# Patient Record
Sex: Male | Born: 1980
Health system: Southern US, Community
[De-identification: ages and names within clinical notes are randomized; demographics above are authoritative.]

## PROBLEM LIST (undated history)

## (undated) DIAGNOSIS — F411 Generalized anxiety disorder: Secondary | ICD-10-CM

## (undated) DIAGNOSIS — R21 Rash and other nonspecific skin eruption: Secondary | ICD-10-CM

## (undated) DIAGNOSIS — K219 Gastro-esophageal reflux disease without esophagitis: Secondary | ICD-10-CM

## (undated) DIAGNOSIS — IMO0001 Reserved for inherently not codable concepts without codable children: Secondary | ICD-10-CM

## (undated) DIAGNOSIS — J302 Other seasonal allergic rhinitis: Secondary | ICD-10-CM

## (undated) HISTORY — DX: Generalized anxiety disorder: F41.1

## (undated) HISTORY — PX: WISDOM TOOTH EXTRACTION: SHX21

## (undated) HISTORY — DX: Gastro-esophageal reflux disease without esophagitis: K21.9

## (undated) HISTORY — DX: Other seasonal allergic rhinitis: J30.2

---
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
## 2002-03-29 ENCOUNTER — Emergency Department (HOSPITAL_COMMUNITY): Admission: EM | Admit: 2002-03-29 | Discharge: 2002-03-29 | Payer: Self-pay | Admitting: *Deleted

## 2002-03-29 ENCOUNTER — Encounter: Payer: Self-pay | Admitting: Emergency Medicine

## 2004-02-10 ENCOUNTER — Encounter: Payer: Self-pay | Admitting: Emergency Medicine

## 2004-02-10 ENCOUNTER — Observation Stay (HOSPITAL_COMMUNITY): Admission: EM | Admit: 2004-02-10 | Discharge: 2004-02-11 | Payer: Self-pay | Admitting: Emergency Medicine

## 2004-02-13 ENCOUNTER — Emergency Department (HOSPITAL_COMMUNITY): Admission: EM | Admit: 2004-02-13 | Discharge: 2004-02-13 | Payer: Self-pay | Admitting: *Deleted

## 2006-05-01 ENCOUNTER — Emergency Department (HOSPITAL_COMMUNITY): Admission: EM | Admit: 2006-05-01 | Discharge: 2006-05-02 | Payer: Self-pay | Admitting: Emergency Medicine
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
## 2012-04-09 ENCOUNTER — Emergency Department (HOSPITAL_BASED_OUTPATIENT_CLINIC_OR_DEPARTMENT_OTHER)
Admission: EM | Admit: 2012-04-09 | Discharge: 2012-04-09 | Disposition: A | Discharge status: Home / Self Care | Payer: Self-pay | Attending: Emergency Medicine | Admitting: Emergency Medicine

## 2012-04-09 ENCOUNTER — Emergency Department (HOSPITAL_BASED_OUTPATIENT_CLINIC_OR_DEPARTMENT_OTHER): Payer: Self-pay

## 2012-04-09 ENCOUNTER — Encounter (HOSPITAL_BASED_OUTPATIENT_CLINIC_OR_DEPARTMENT_OTHER): Payer: Self-pay

## 2012-04-09 DIAGNOSIS — K122 Cellulitis and abscess of mouth: Secondary | ICD-10-CM | POA: Insufficient documentation

## 2012-04-09 DIAGNOSIS — F17200 Nicotine dependence, unspecified, uncomplicated: Secondary | ICD-10-CM | POA: Insufficient documentation

## 2012-04-09 LAB — CBC WITH DIFFERENTIAL/PLATELET
Basophils Relative: 0 % (ref 0–1)
HCT: 42.6 % (ref 39.0–52.0)
Lymphs Abs: 1.5 10*3/uL (ref 0.7–4.0)
MCH: 32.7 pg (ref 26.0–34.0)
MCV: 94 fL (ref 78.0–100.0)
Neutrophils Relative %: 83 % — ABNORMAL HIGH (ref 43–77)
RBC: 4.53 MIL/uL (ref 4.22–5.81)
RDW: 12.2 % (ref 11.5–15.5)

## 2012-04-09 LAB — RAPID STREP SCREEN (MED CTR MEBANE ONLY): Streptococcus, Group A Screen (Direct): NEGATIVE

## 2012-04-09 MED ORDER — CEFTRIAXONE SODIUM 1 G IJ SOLR
1.0000 g | Freq: Once | INTRAMUSCULAR | Status: AC
  Administered 2012-04-09: 1 g via INTRAMUSCULAR
  Filled 2012-04-09: qty 10

## 2012-04-09 MED ORDER — LIDOCAINE HCL (PF) 1 % IJ SOLN
INTRAMUSCULAR | Status: AC
  Administered 2012-04-09: 5 mL
  Filled 2012-04-09: qty 5

## 2012-04-09 MED ORDER — PREDNISONE 50 MG PO TABS
60.0000 mg | ORAL_TABLET | Freq: Once | ORAL | Status: AC
  Administered 2012-04-09: 60 mg via ORAL
  Filled 2012-04-09: qty 1

## 2012-04-09 MED ORDER — PREDNISONE 10 MG PO TABS: 20.0000 mg | ORAL_TABLET | Freq: Every day | ORAL | Status: DC

## 2012-04-09 MED ORDER — IOHEXOL 350 MG/ML SOLN
100.0000 mL | Freq: Once | INTRAVENOUS | Status: AC | PRN
  Administered 2012-04-09: 100 mL via INTRAVENOUS

## 2012-04-09 NOTE — ED Notes (Signed)
CT scan results printed and given to Dr Rosalia Hammers.

## 2012-04-09 NOTE — ED Notes (Signed)
Pt states that his uvula is swelling and that he is having difficulty swallowing, onset two hours ago, took two benadryl no relief.

## 2012-04-09 NOTE — ED Provider Notes (Signed)
History     CSN: [id number]  Arrival date & time 04/09/12  1025   First MD Initiated Contact with Patient 04/09/12 1050      Chief Complaint  Patient presents with  . Oral Swelling    (Consider location/radiation/quality/duration/timing/severity/associated sxs/prior treatment) HPI  Patient noted swelling and pain in throat this a.m.  Patient states he did not go to bed last night.  He was with a friend, drank about a six pack, friend went home about 4 a.m. Then noted throat feeling weird.  Denies fever, some pain.  Patient took two bendadryl about 35 minutes ago.    History reviewed. No pertinent past medical history.  History reviewed. No pertinent past surgical history.  History reviewed. No pertinent family history.  History  Substance Use Topics  . Smoking status: Current Every Day Smoker -- 1.0 packs/day    Types: Cigarettes  . Smokeless tobacco: Never Used  . Alcohol Use: 3.0 oz/week    5 Cans of beer per week      Review of Systems  All other systems reviewed and are negative.    Allergies  Review of patient's allergies indicates no known allergies.  Home Medications   Current Outpatient Rx  Name  Route  Sig  Dispense  Refill  . DIPHENHYDRAMINE HCL 25 MG PO CAPS   Oral   Take 50 mg by mouth every 6 (six) hours as needed.           BP 122/72  Pulse 82  Temp 98 F (36.7 C) (Oral)  Resp 16  Ht 6\' 2"  (1.88 m)  Wt 165 lb (74.844 kg)  BMI 21.18 kg/m2  SpO2 100%  Physical Exam  Nursing note and vitals reviewed. Constitutional: He is oriented to person, place, and time. He appears well-developed.  HENT:  Head: Normocephalic and atraumatic.  Right Ear: External ear normal.  Left Ear: External ear normal.  Nose: Nose normal.       Uvula red and swollen  Eyes: Conjunctivae normal and EOM are normal. Pupils are equal, round, and reactive to light.  Neck: Normal range of motion. Neck supple.  Cardiovascular: Normal rate, regular rhythm and normal  heart sounds.   Pulmonary/Chest: Effort normal and breath sounds normal.  Abdominal: Soft. Bowel sounds are normal.  Musculoskeletal: Normal range of motion.  Neurological: He is alert and oriented to person, place, and time.  Skin: Skin is warm.  Psychiatric: He has a normal mood and affect. Thought content normal.    ED Course  Procedures (including critical care time)  Labs Reviewed - No data to display No results found.   No diagnosis found.    STrep screen done and negative.  Patient given im rocephin and prednisone.  Observed x 2 hours and no additional swelling.  Patient speaking clearly.          Hilario Quarry, MD 04/09/12 [id number]

## 2012-11-08 ENCOUNTER — Encounter (HOSPITAL_BASED_OUTPATIENT_CLINIC_OR_DEPARTMENT_OTHER): Payer: Self-pay

## 2012-11-08 ENCOUNTER — Emergency Department (HOSPITAL_BASED_OUTPATIENT_CLINIC_OR_DEPARTMENT_OTHER)
Admission: EM | Admit: 2012-11-08 | Discharge: 2012-11-08 | Disposition: A | Discharge status: Home / Self Care | Payer: Self-pay | Attending: Emergency Medicine | Admitting: Emergency Medicine

## 2012-11-08 DIAGNOSIS — Z791 Long term (current) use of non-steroidal anti-inflammatories (NSAID): Secondary | ICD-10-CM | POA: Insufficient documentation

## 2012-11-08 DIAGNOSIS — K4090 Unilateral inguinal hernia, without obstruction or gangrene, not specified as recurrent: Secondary | ICD-10-CM | POA: Insufficient documentation

## 2012-11-08 DIAGNOSIS — Z87891 Personal history of nicotine dependence: Secondary | ICD-10-CM | POA: Insufficient documentation

## 2012-11-08 DIAGNOSIS — IMO0002 Reserved for concepts with insufficient information to code with codable children: Secondary | ICD-10-CM | POA: Insufficient documentation

## 2012-11-08 MED ORDER — NAPROXEN 500 MG PO TABS: 500.0000 mg | ORAL_TABLET | Freq: Two times a day (BID) | ORAL | Status: DC

## 2012-11-08 NOTE — ED Notes (Signed)
Pt reports left groin pain and urinary frequency that started 1 week ago.  States he does heavy lifting on his job.

## 2012-11-08 NOTE — ED Provider Notes (Signed)
CSN: [id number]     Arrival date & time 11/08/12  1003 History   First MD Initiated Contact with Patient 11/08/12 1045     Chief Complaint  Patient presents with  . Groin Pain   (Consider location/radiation/quality/duration/timing/severity/associated sxs/prior Treatment) HPI Comments: 32 year old male with a history of one week of left groin pain. He states this started spontaneously, he does heavy lifting for his job and has had occasional straining on bowel movements. He notices that he has a bulge in his left groin, this is worse when he picks up heavy things, better when he lays down at night and not associated with abdominal pain, dysuria, diarrhea, fever or nausea or vomiting. At this time his symptoms are mild.  Patient is a 32 y.o. male presenting with groin pain. The history is provided by the patient.  Groin Pain    History reviewed. No pertinent past medical history. History reviewed. No pertinent past surgical history. No family history on file. History  Substance Use Topics  . Smoking status: Former Smoker -- 1.00 packs/day    Types: Cigarettes  . Smokeless tobacco: Never Used  . Alcohol Use: 3.0 oz/week    5 Cans of beer per week     Comment: 3-4 beers nightly    Review of Systems  All other systems reviewed and are negative.    Allergies  Review of patient's allergies indicates no known allergies.  Home Medications   Current Outpatient Rx  Name  Route  Sig  Dispense  Refill  . diphenhydrAMINE (BENADRYL) 25 mg capsule   Oral   Take 50 mg by mouth every 6 (six) hours as needed.         . naproxen (NAPROSYN) 500 MG tablet   Oral   Take 1 tablet (500 mg total) by mouth 2 (two) times daily with a meal.   30 tablet   0   . predniSONE (DELTASONE) 10 MG tablet   Oral   Take 2 tablets (20 mg total) by mouth daily.   15 tablet   0    Ht 6\' 2"  (1.88 m)  Wt 165 lb (74.844 kg)  BMI 21.18 kg/m2 Physical Exam  Nursing note and vitals  reviewed. Constitutional: He appears well-developed and well-nourished. No distress.  HENT:  Head: Normocephalic and atraumatic.  Mouth/Throat: Oropharynx is clear and moist. No oropharyngeal exudate.  Eyes: Conjunctivae and EOM are normal. Pupils are equal, round, and reactive to light. Right eye exhibits no discharge. Left eye exhibits no discharge. No scleral icterus.  Neck: Normal range of motion. Neck supple. No JVD present. No thyromegaly present.  Cardiovascular: Normal rate, regular rhythm, normal heart sounds and intact distal pulses.  Exam reveals no gallop and no friction rub.   No murmur heard. Pulmonary/Chest: Effort normal and breath sounds normal. No respiratory distress. He has no wheezes. He has no rales.  Abdominal: Soft. Bowel sounds are normal. He exhibits no distension and no mass. There is no tenderness.  Genitourinary:  Normal appearing circumcised penis and testicles, normal scrotum, inguinal hernia present on the left, this is easily reducible, not incarcerated, nontender. His reduces spontaneously with a Trendelenburg position  Musculoskeletal: Normal range of motion. He exhibits no edema and no tenderness.  Lymphadenopathy:    He has no cervical adenopathy.  Neurological: He is alert. Coordination normal.  Skin: Skin is warm and dry. No rash noted. No erythema.  Psychiatric: He has a normal mood and affect. His behavior is normal.  ED Course  Procedures (including critical care time) Labs Review Labs Reviewed - No data to display Imaging Review No results found.  MDM   1. Inguinal hernia, left    The patient has what appears to be in inguinal hernia which is easily reducible and not incarcerated. He otherwise appears well and can be referred to outpatient surgical services for elective repair.    Meds given in ED:  Medications - No data to display  New Prescriptions   NAPROXEN (NAPROSYN) 500 MG TABLET    Take 1 tablet (500 mg total) by mouth 2  (two) times daily with a meal.        Vida Roller, MD 11/08/12 1056

## 2012-11-17 ENCOUNTER — Encounter (INDEPENDENT_AMBULATORY_CARE_PROVIDER_SITE_OTHER): Payer: Self-pay | Admitting: General Surgery

## 2012-11-17 ENCOUNTER — Ambulatory Visit (INDEPENDENT_AMBULATORY_CARE_PROVIDER_SITE_OTHER): Payer: Self-pay | Admitting: General Surgery

## 2012-11-17 ENCOUNTER — Telehealth (INDEPENDENT_AMBULATORY_CARE_PROVIDER_SITE_OTHER): Payer: Self-pay | Admitting: General Surgery

## 2012-11-17 VITALS — BP 122/76 | HR 74 | Temp 97.1°F | Resp 16 | Ht 74.0 in | Wt 161.2 lb

## 2012-11-17 DIAGNOSIS — K4090 Unilateral inguinal hernia, without obstruction or gangrene, not specified as recurrent: Secondary | ICD-10-CM

## 2012-11-17 DIAGNOSIS — K429 Umbilical hernia without obstruction or gangrene: Secondary | ICD-10-CM

## 2012-11-17 NOTE — Telephone Encounter (Signed)
Pt made aware of financial obligation/self pay/ will call when ready to schedule

## 2012-11-17 NOTE — Progress Notes (Signed)
Patient ID: James Gallagher, male   DOB: 1980/04/13, 32 y.o.   MRN: [id number]  No chief complaint on file.   HPI James Gallagher is a 32 y.o. male.  This patient is referred by Dr. Hyacinth Meeker at the emergency room for evaluation of a symptomatic left inguinal hernia. He says that he noticed this with a bulge in his left groin about one week prior to his presentation to the emergency room. He noticed a bulge in his groin and he began having some discomfort in his left groin and left testicle and after doing some research on the cervix metastatic to go to the emergency room for evaluation since he does not has a regular physician. He says it has not changed in size but is intermittently bulging. He says that it also does cause him occasional discomfort with intermittently feeling as though he "was kicked in the groin".  He denies any obstructive symptoms and says that his bowels are normal. He does do a lot of lifting at work and this is aggravated and that work as well. HPI  No past medical history on file.  No past surgical history on file.  No family history on file.  Social History History  Substance Use Topics  . Smoking status: Former Smoker -- 1.00 packs/day    Types: Cigarettes  . Smokeless tobacco: Never Used  . Alcohol Use: 3.0 oz/week    5 Cans of beer per week     Comment: 3-4 beers nightly    No Known Allergies  Current Outpatient Prescriptions  Medication Sig Dispense Refill  . diphenhydrAMINE (BENADRYL) 25 mg capsule Take 50 mg by mouth every 6 (six) hours as needed.      . naproxen (NAPROSYN) 500 MG tablet Take 1 tablet (500 mg total) by mouth 2 (two) times daily with a meal.  30 tablet  0  . predniSONE (DELTASONE) 10 MG tablet Take 2 tablets (20 mg total) by mouth daily.  15 tablet  0   No current facility-administered medications for this visit.    Review of Systems Review of Systems All other review of systems negative or noncontributory except as stated in the  HPI  Blood pressure 122/76, pulse 74, temperature 97.1 F (36.2 C), temperature source Temporal, resp. rate 16, height 6\' 2"  (1.88 m), weight 161 lb 3.2 oz (73.12 kg).  Physical Exam Physical Exam Physical Exam  Vitals reviewed. Constitutional: He is oriented to person, place, and time. He appears well-developed and well-nourished. No distress.  HENT:  Head: Normocephalic and atraumatic.  Mouth/Throat: No oropharyngeal exudate.  Eyes: Conjunctivae and EOM are normal. Pupils are equal, round, and reactive to light. Right eye exhibits no discharge. Left eye exhibits no discharge. No scleral icterus.  Neck: Normal range of motion. No tracheal deviation present.  Cardiovascular: Normal rate, regular rhythm and normal heart sounds.   Pulmonary/Chest: Effort normal and breath sounds normal. No stridor. No respiratory distress. He has no wheezes. He has no rales. He exhibits no tenderness.  Abdominal: Soft. Bowel sounds are normal. He exhibits no distension and no mass. There is no tenderness. There is no rebound and no guarding. He has a small reducible umbilical hernia at the base of his umbilicus as well as a small reducible left inguinal hernia on exam. I do not appreciate any right inguinal hernia on exam Musculoskeletal: Normal range of motion. He exhibits no edema and no tenderness.  Neurological: He is alert and oriented to person, place, and time.  Skin: Skin is warm and dry. No rash noted. He is not diaphoretic. No erythema. No pallor.  Psychiatric: He has a normal mood and affect. His behavior is normal. Judgment and thought content normal.    Data Reviewed ER notes  Assessment    Left inguinal hernia-reducible Umbilical hernia-reducible He has a symptomatic but reducible left inguinal hernia by history and exam. He also has an asymptomatic and reducible small umbilical hernia. I discussed with him the options for continued watchful waiting versus surgical repair we discussed both  laparoscopic and open repair of his hernias.  We discussed the pros and cons and risks and benefits of each and we do think that laparoscopic repair would probably be best for him.  We could repair both defects with the same incisions. We discussed the risks of the surgery including infection, bleeding, pain, scarring, recurrence, need for open surgery, bowel injury, injury to the vas deferens or testicle, persistent pain and nerve injury and he expressed understanding and would like to proceed with a laparoscopic left inguinal hernia repair with mesh and possible open umbilical hernia repair with or without mesh.    Plan    We will set him up for a laparoscopic left inguinal hernia repair with possible mesh and umbilical hernia repair when convenient.        Lodema Pilot DAVID 11/17/2012, 1:48 PM

## 2014-12-05 ENCOUNTER — Ambulatory Visit (INDEPENDENT_AMBULATORY_CARE_PROVIDER_SITE_OTHER): Payer: Commercial Managed Care - HMO | Admitting: Family Medicine

## 2014-12-05 ENCOUNTER — Encounter: Payer: Self-pay | Admitting: Family Medicine

## 2014-12-05 VITALS — BP 103/71 | HR 70 | Temp 97.9°F | Resp 16 | Ht 73.0 in | Wt 162.0 lb

## 2014-12-05 DIAGNOSIS — J069 Acute upper respiratory infection, unspecified: Secondary | ICD-10-CM

## 2014-12-05 DIAGNOSIS — F410 Panic disorder [episodic paroxysmal anxiety] without agoraphobia: Secondary | ICD-10-CM

## 2014-12-05 MED ORDER — CLONAZEPAM 0.5 MG PO TABS
ORAL_TABLET | ORAL | Status: DC
Start: 2014-12-05 — End: 2015-01-11

## 2014-12-05 MED ORDER — CITALOPRAM HYDROBROMIDE 20 MG PO TABS: 20.0000 mg | ORAL_TABLET | Freq: Every day | ORAL | Status: DC

## 2014-12-05 NOTE — Patient Instructions (Signed)
Buy generic OTC robitussin DM and take as directed on packaging.

## 2014-12-05 NOTE — Progress Notes (Signed)
Office Note 12/05/2014  CC:  Chief Complaint  Patient presents with  . Establish Care  . URI    HPI:  James Gallagher is a 34 y.o. White male who is here to establish care and discuss SOB. Patient's most recent primary MD: none Old records in [facility] EMR were reviewed prior to or during today's visit.  Pt reports having spells of anxiety coming "out of nowhere" for the last year. Feels intense anxiety w/out trigger.  Recently was standing in line waiting to buy a ticket to an event and had intense feeling that he had to get out of there.  Feels SOB when it happens, but really no other physical symptoms such as palpitations, dizziness, vision sx's, or chest pain.  Panic period typically lasts 1/2-1 hour. These spells of panic have occurred about 4 times in last month--this has gradually worsened over the last year. Between panic attacks he worries about having another.   No past history of generalized anxiety, no hx of depression. +FH depression in mother.    Also, has had 2-3 days of nasal congestion, cough, malaise.  Scratchy throat.  No SOB or wheezing.  Past Medical History  Diagnosis Date  . Left inguinal hernia     not repaired yet  . Seasonal allergies     Past Surgical History  Procedure Laterality Date  . Wisdom tooth extraction      Family History  Problem Relation Age of Onset  . Alcohol abuse Mother   . Arthritis Mother   . Rheum arthritis Maternal Grandfather   . Cancer Neg Hx   . Diabetes Neg Hx   . Heart disease Neg Hx     Social History   Social History  . Marital Status: Single    Spouse Name: N/A  . Number of Children: N/A  . Years of Education: N/A   Occupational History  . Not on file.   Social History Main Topics  . Smoking status: Former Smoker -- 1.00 packs/day for 5 years    Types: Cigarettes    Quit date: 03/16/2012  . Smokeless tobacco: Never Used  . Alcohol Use: 3.0 oz/week    5 Cans of beer per week     Comment: 3-4 beers  nightly  . Drug Use: Yes    Special: Marijuana, Cocaine  . Sexual Activity: Yes    Birth Control/ Protection: Condom   Other Topics Concern  . Not on file   Social History Narrative   Single, no children.   College: [employer].   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   Former smoker: 5 pack-yr hx, quit 2014.   Alc: beer, 2-3 per evening.   No hx of alc or drug abuse.    Outpatient Encounter Prescriptions as of 12/05/2014  Medication Sig  . citalopram (CELEXA) 20 MG tablet Take 1 tablet (20 mg total) by mouth daily.  . clonazePAM (KLONOPIN) 0.5 MG tablet 1-2 tabs po bid prn severe anxiety  . [DISCONTINUED] diphenhydrAMINE (BENADRYL) 25 mg capsule Take 50 mg by mouth every 6 (six) hours as needed.  . [DISCONTINUED] naproxen (NAPROSYN) 500 MG tablet Take 1 tablet (500 mg total) by mouth 2 (two) times daily with a meal. (Patient not taking: Reported on 12/05/2014)  . [DISCONTINUED] predniSONE (DELTASONE) 10 MG tablet Take 2 tablets (20 mg total) by mouth daily. (Patient not taking: Reported on 12/05/2014)   No facility-administered encounter medications on file as of 12/05/2014.  No Known Allergies  ROS Review of Systems  Constitutional: Positive for fatigue. Negative for fever.  HENT: Positive for postnasal drip. Negative for ear pain, mouth sores, sore throat and trouble swallowing.   Eyes: Negative for visual disturbance.  Respiratory: Positive for cough. Negative for shortness of breath.   Cardiovascular: Negative for chest pain, palpitations and leg swelling.  Gastrointestinal: Negative for nausea and abdominal pain.  Genitourinary: Negative for dysuria.  Musculoskeletal: Negative for back pain and joint swelling.  Skin: Negative for rash.  Neurological: Negative for weakness and headaches.  Hematological: Negative for adenopathy.  Psychiatric/Behavioral: The patient is nervous/anxious (as per HPI).     PE; Blood pressure 103/71, pulse 70,  temperature 97.9 F (36.6 C), temperature source Oral, resp. rate 16, height 6\' 1"  (1.854 m), weight 162 lb (73.483 kg), SpO2 99 %. VS: noted--normal. Gen: alert, NAD, NONTOXIC APPEARING. HEENT: eyes without injection, drainage, or swelling.  Ears: EACs clear, TMs with normal light reflex and landmarks.  Nose: Clear rhinorrhea, with some dried, crusty exudate adherent to mildly injected mucosa.  No purulent d/c.  No paranasal sinus TTP.  No facial swelling.  Throat and mouth without focal lesion.  No pharyngial swelling, erythema, or exudate.   Neck: supple, no LAD.   LUNGS: CTA bilat, nonlabored resps.   CV: RRR, no m/r/g. EXT: no c/c/e SKIN: no rash Neuro: CN 2-12 intact bilaterally, strength 5/5 in proximal and distal upper extremities and lower extremities bilaterally.    No tremor.  No disdiadochokinesis.  No ataxia.  Upper extremity and lower extremity DTRs symmetric.  No pronator drift.  Pertinent labs:  none  ASSESSMENT AND PLAN:   New pt; no old records to obtain.  1) Panic disorder: discussed dx, reassured pt. Start trial of citalopram 20mg  qd.  Therapeutic expectations and side effect profile of medication discussed today.  Patient's questions answered. Clonaz 0.5, 1-2 bid prn severe anxiety days or for situational anxiety (like flying in a plane to [facility] soon).  2) Viral URI: symptomatic care with robitussin DM discussed. Push fluids, rest.  An After Visit Summary was printed and given to the patient.  Spent 30 min with pt today, with >50% of this time spent in counseling and care coordination regarding the above problems.  Return in about 6 weeks (around 01/16/2015).

## 2014-12-05 NOTE — Progress Notes (Signed)
Pre visit review using our clinic review tool, if applicable. No additional management support is needed unless otherwise documented below in the visit note. 

## 2014-12-06 ENCOUNTER — Other Ambulatory Visit: Payer: Self-pay | Admitting: Family Medicine

## 2014-12-06 ENCOUNTER — Telehealth: Payer: Self-pay | Admitting: *Deleted

## 2014-12-06 MED ORDER — DULOXETINE HCL 30 MG PO CPEP: 30.0000 mg | ORAL_CAPSULE | Freq: Every day | ORAL | Status: DC

## 2014-12-06 NOTE — Telephone Encounter (Signed)
OK, stop citalopram. I'll send in rx for generic cymbalta for him to try instead.

## 2014-12-06 NOTE — Telephone Encounter (Signed)
Pt LMOM on 12/06/14 at 1:28pm  Pt stated that he started he took the Celexa yesterday and last night he woke up feeling strange/not himself. He stated that he wasn't sure if he should continue medication or if there was something else that could be called in. He stated that he really did not like how how it made him feel. Since pt has only took one dose I advised him to stop taking the Celexa and wait for a call back to determine what needs to be done. Please advise. Thanks.

## 2014-12-07 NOTE — Telephone Encounter (Signed)
Patient aware of new Rx at his pharmacy.

## 2015-01-08 ENCOUNTER — Telehealth: Payer: Self-pay | Admitting: Family Medicine

## 2015-01-08 DIAGNOSIS — K4090 Unilateral inguinal hernia, without obstruction or gangrene, not specified as recurrent: Secondary | ICD-10-CM

## 2015-01-08 DIAGNOSIS — K429 Umbilical hernia without obstruction or gangrene: Secondary | ICD-10-CM

## 2015-01-08 NOTE — Telephone Encounter (Signed)
Patient saw surgeon (doesn't remember the person's name) at [facility] several years ago for a hernia in his groin area. He didn't schedule the surgery then bc he didn't have insurance. He now has insurance and is having a hard time working. Patient requesting a referral.

## 2015-01-08 NOTE — Telephone Encounter (Signed)
Please advise. Thanks.  

## 2015-01-08 NOTE — Telephone Encounter (Signed)
OK, referral ordered as per pt request. 

## 2015-01-10 NOTE — Telephone Encounter (Signed)
Per Diane apt has been made and was scheduled by pt.

## 2015-01-11 ENCOUNTER — Telehealth: Payer: Self-pay | Admitting: *Deleted

## 2015-01-11 MED ORDER — CLONAZEPAM 0.5 MG PO TABS: ORAL_TABLET | ORAL | Status: DC

## 2015-01-11 NOTE — Telephone Encounter (Signed)
Pt advised and voiced understanding.  Rx faxed.  

## 2015-01-11 NOTE — Telephone Encounter (Signed)
Stick with clonazepam, and he can take 2-3 of the tabs for his upcoming flight. New rx printed.

## 2015-01-11 NOTE — Telephone Encounter (Signed)
Pt LMOM on 01/11/15 at 8:20am requesting a call back in regards to his recent ov with Dr. Anitra Lauth.  I spoke to pt and he stated that he was started on two new medications at his last ov. He stated that he did not start the Cymbalta because he did not feel like he had depression and did not want to risk taking it then having to wean off it. He stated that he did start taking the clonazepam and has only been taking a half tablet as needed. He stated that he has a trip to [facility] coming up Monday and he is really nervous about the flight. He stated that he needs something to help with the stress and anxiety. He stated that he can tell that the clonazepam is helping some but he is trying to space out the doses because he was only given 10 tablets. He wants to know what Dr. Anitra Lauth recommends, does he want to send in more clonazepam or should he start another medication (he did mention Ativan). Please advise. Thanks.

## 2015-01-23 ENCOUNTER — Encounter: Payer: Self-pay | Admitting: Family Medicine

## 2015-01-23 ENCOUNTER — Ambulatory Visit: Payer: Commercial Managed Care - HMO | Admitting: Family Medicine

## 2015-04-04 ENCOUNTER — Ambulatory Visit: Payer: Self-pay | Admitting: General Surgery

## 2015-04-04 NOTE — H&P (Signed)
History of Present Illness Ralene Ok MD; 01/23/2015 9:22 AM) The patient is a 35 year old male who presents with an inguinal hernia. The patient is a 35 year old male who is referred by Dr. Ricardo Jericho for evaluation of a left inguinal hernia. The patient states she's had this hernia for over 2 years. 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 He would like to have this repaired.     Other Problems Elbert Ewings, CMA; 01/23/2015 9:07 AM) Anxiety Disorder Inguinal Hernia  Past Surgical History Elbert Ewings, [place]; 01/23/2015 9:07 AM) Oral Surgery  Diagnostic Studies History Elbert Ewings, [place]; 01/23/2015 9:07 AM) Colonoscopy never  Allergies Elbert Ewings, CMA; 01/23/2015 9:07 AM) No Known Drug Allergies11/11/2014  Medication History Elbert Ewings, CMA; 01/23/2015 9:08 AM) ClonazePAM (0.5MG  Tablet, Oral as needed) Active. Medications Reconciled    Review of Systems Elbert Ewings CMA; 01/23/2015 9:07 AM) General Not Present- Appetite Loss, Chills, Fatigue, Fever, Night Sweats, Weight Gain and Weight Loss. Skin Not Present- Change in Wart/Mole, Dryness, Hives, Jaundice, New Lesions, Non-Healing Wounds, Rash and Ulcer. HEENT Present- Seasonal Allergies. Not Present- Earache, Hearing Loss, Hoarseness, Nose Bleed, Oral Ulcers, Ringing in the Ears, Sinus Pain, Sore Throat, Visual Disturbances, Wears glasses/contact lenses and Yellow Eyes. Respiratory Not Present- Bloody sputum, Chronic Cough, Difficulty Breathing, Snoring and Wheezing. Breast Not Present- Breast Mass, Breast Pain, Nipple Discharge and Skin Changes. Cardiovascular Not Present- Chest Pain, Difficulty Breathing Lying Down, Leg Cramps, Palpitations, Rapid Heart Rate, Shortness of Breath and Swelling of Extremities. Gastrointestinal Present- Abdominal Pain. Not Present- Bloating, Bloody Stool, Change  in Bowel Habits, Chronic diarrhea, Constipation, Difficulty Swallowing, Excessive gas, Gets full quickly at meals, Hemorrhoids, Indigestion, Nausea, Rectal Pain and Vomiting. Male Genitourinary Present- Change in Urinary Stream. Not Present- Blood in Urine, Frequency, Impotence, Nocturia, Painful Urination, Urgency and Urine Leakage. Musculoskeletal Not Present- Back Pain, Joint Pain, Joint Stiffness, Muscle Pain, Muscle Weakness and Swelling of Extremities. Neurological Not Present- Decreased Memory, Fainting, Headaches, Numbness, Seizures, Tingling, Tremor, Trouble walking and Weakness. Psychiatric Present- Anxiety. Not Present- Bipolar, Change in Sleep Pattern, Depression, Fearful and Frequent crying. Endocrine Not Present- Cold Intolerance, Excessive Hunger, Hair Changes, Heat Intolerance, Hot flashes and New Diabetes. Hematology Not Present- Easy Bruising, Excessive bleeding, Gland problems, HIV and Persistent Infections.  Vitals Elbert Ewings CMA; 01/23/2015 9:08 AM) 01/23/2015 9:08 AM Weight: 162.8 lb Height: 73in Body Surface Area: 1.97 m Body Mass Index: 21.48 kg/m  Temp.: 98.71F(Temporal)  Pulse: 78 (Regular)  BP: 128/70 (Sitting, Left Arm, Standard)       Physical Exam Ralene Ok, MD; 01/23/2015 9:23 AM) General Mental Status-Alert. General Appearance-Consistent with stated age. Hydration-Well hydrated. Voice-Normal.  Head and Neck Head-normocephalic, atraumatic with no lesions or palpable masses. Trachea-midline.  Eye Eyeball - Bilateral-Extraocular movements intact. Sclera/Conjunctiva - Bilateral-No scleral icterus.  Chest and Lung Exam Chest and lung exam reveals -quiet, even and easy respiratory effort with no use of accessory muscles. Inspection Chest Wall - Normal. Back - normal.  Cardiovascular Cardiovascular examination reveals -normal heart sounds, regular rate and rhythm with no murmurs.  Abdomen Inspection Skin -  Scar - no surgical scars. Hernias - Inguinal hernia - Left - Reducible. Palpation/Percussion Normal exam - Soft, Non Tender, No Rebound tenderness, No Rigidity (guarding) and No hepatosplenomegaly. Auscultation Normal exam - Bowel sounds normal.  Neurologic Neurologic evaluation reveals -alert and oriented x 3 with no impairment of  recent or remote memory. Mental Status-Normal.  Musculoskeletal Normal Exam - Left-Upper Extremity Strength Normal and Lower Extremity Strength Normal. Normal Exam - Right-Upper Extremity Strength Normal, Lower Extremity Weakness.    Assessment & Plan Ralene Ok MD; 01/23/2015 9:23 AM) LEFT INGUINAL HERNIA (K40.90) Impression: 35 year old male with a left, large inguinal hernia.  1. The patient will like to proceed to the operating room for laparoscopic left inguinal hernia repair, possible open.  2. I discussed with the patient the signs and symptoms of incarceration and strangulation and the need to proceed to the ER should they occur.  3. I discussed with the patient the risks and benefits of the procedure to include but not limited to: Infection, bleeding, damage to surrounding structures, possible need for further surgery, possible nerve pain, and possible recurrence. The patient was understanding and wishes to proceed.

## 2015-04-05 NOTE — Patient Instructions (Addendum)
YOUR PROCEDURE IS SCHEDULED ON : 04/10/15  REPORT TO [facility] MAIN ENTRANCE FOLLOW SIGNS TO EAST ELEVATOR - GO TO 3rd FLOOR CHECK IN AT 3 EAST NURSES STATION (SHORT STAY) AT:  6:30 AM  CALL THIS NUMBER IF YOU HAVE PROBLEMS THE MORNING OF SURGERY [phone]  REMEMBER:ONLY 1 PER PERSON MAY GO TO SHORT STAY WITH YOU TO GET READY THE MORNING OF YOUR SURGERY  DO NOT EAT FOOD OR DRINK LIQUIDS AFTER MIDNIGHT  TAKE THESE MEDICINES THE MORNING OF SURGERY:MAY TAKE KLONOPIN  YOU MAY NOT HAVE ANY METAL ON YOUR BODY INCLUDING HAIR PINS AND PIERCING'S. DO NOT WEAR JEWELRY, MAKEUP, LOTIONS, POWDERS OR PERFUMES. DO NOT WEAR NAIL POLISH. DO NOT SHAVE 48 HRS PRIOR TO SURGERY. MEN MAY SHAVE FACE AND NECK.  DO NOT [place]. [facility] IS NOT RESPONSIBLE FOR VALUABLES.  CONTACTS, DENTURES OR PARTIALS MAY NOT BE WORN TO SURGERY. LEAVE SUITCASE IN CAR. CAN BE BROUGHT TO ROOM AFTER SURGERY.  PATIENTS DISCHARGED THE DAY OF SURGERY WILL NOT BE ALLOWED TO DRIVE HOME.  PLEASE READ OVER THE FOLLOWING INSTRUCTION SHEETS _________________________________________________________________________________                                           - PREPARING FOR SURGERY  Before surgery, you can play an important role.  Because skin is not sterile, your skin needs to be as free of germs as possible.  You can reduce the number of germs on your skin by washing with CHG (chlorahexidine gluconate) soap before surgery.  CHG is an antiseptic cleaner which kills germs and bonds with the skin to continue killing germs even after washing. Please DO NOT use if you have an allergy to CHG or antibacterial soaps.  If your skin becomes reddened/irritated stop using the CHG and inform your nurse when you arrive at Short Stay. Do not shave (including legs and underarms) for at least 48 hours prior to the first CHG shower.  You may shave your face. Please follow these instructions  carefully:   1.  Shower with CHG Soap the night before surgery and the  morning of Surgery.   2.  If you choose to wash your hair, wash your hair first as usual with your  normal  Shampoo.   3.  After you shampoo, rinse your hair and body thoroughly to remove the  shampoo.                                         4.  Use CHG as you would any other liquid soap.  You can apply chg directly  to the skin and wash . Gently wash with scrungie or clean wascloth    5.  Apply the CHG Soap to your body ONLY FROM THE NECK DOWN.   Do not use on open                           Wound or open sores. Avoid contact with eyes, ears mouth and genitals (private parts).                        Genitals (private parts) with your normal soap.  6.  Wash thoroughly, paying special attention to the area where your surgery  will be performed.   7.  Thoroughly rinse your body with warm water from the neck down.   8.  DO NOT shower/wash with your normal soap after using and rinsing off  the CHG Soap .                9.  Pat yourself dry with a clean towel.             10.  Wear clean night clothes to bed after shower             11.  Place clean sheets on your bed the night of your first shower and do not  sleep with pets.  Day of Surgery : Do not apply any lotions/deodorants the morning of surgery.  Please wear clean clothes to the hospital/surgery center.  FAILURE TO FOLLOW THESE INSTRUCTIONS MAY RESULT IN THE CANCELLATION OF YOUR SURGERY    PATIENT SIGNATURE_________________________________  ______________________________________________________________________

## 2015-04-08 ENCOUNTER — Encounter (HOSPITAL_COMMUNITY)
Admission: RE | Admit: 2015-04-08 | Discharge: 2015-04-08 | Disposition: A | Discharge status: Home / Self Care | Payer: Commercial Managed Care - HMO | Source: Ambulatory Visit | Attending: General Surgery | Admitting: General Surgery

## 2015-04-08 ENCOUNTER — Encounter (INDEPENDENT_AMBULATORY_CARE_PROVIDER_SITE_OTHER): Payer: Self-pay

## 2015-04-08 ENCOUNTER — Encounter (HOSPITAL_COMMUNITY): Payer: Self-pay

## 2015-04-08 DIAGNOSIS — K219 Gastro-esophageal reflux disease without esophagitis: Secondary | ICD-10-CM | POA: Diagnosis not present

## 2015-04-08 DIAGNOSIS — Z87891 Personal history of nicotine dependence: Secondary | ICD-10-CM | POA: Diagnosis not present

## 2015-04-08 DIAGNOSIS — Z79899 Other long term (current) drug therapy: Secondary | ICD-10-CM | POA: Diagnosis not present

## 2015-04-08 DIAGNOSIS — K4090 Unilateral inguinal hernia, without obstruction or gangrene, not specified as recurrent: Secondary | ICD-10-CM | POA: Diagnosis present

## 2015-04-08 DIAGNOSIS — F419 Anxiety disorder, unspecified: Secondary | ICD-10-CM | POA: Diagnosis not present

## 2015-04-08 DIAGNOSIS — D176 Benign lipomatous neoplasm of spermatic cord: Secondary | ICD-10-CM | POA: Diagnosis not present

## 2015-04-08 HISTORY — DX: Rash and other nonspecific skin eruption: R21

## 2015-04-08 HISTORY — DX: Reserved for inherently not codable concepts without codable children: IMO0001

## 2015-04-08 LAB — CBC
HCT: 45.5 % (ref 39.0–52.0)
HEMOGLOBIN: 14.8 g/dL (ref 13.0–17.0)
MCH: 32.1 pg (ref 26.0–34.0)
MCHC: 32.5 g/dL (ref 30.0–36.0)
MCV: 98.7 fL (ref 78.0–100.0)
Platelets: 235 10*3/uL (ref 150–400)
RBC: 4.61 MIL/uL (ref 4.22–5.81)
RDW: 12.6 % (ref 11.5–15.5)
WBC: 6 10*3/uL (ref 4.0–10.5)

## 2015-04-10 ENCOUNTER — Encounter (HOSPITAL_COMMUNITY): Payer: Self-pay | Admitting: *Deleted

## 2015-04-10 ENCOUNTER — Encounter (HOSPITAL_COMMUNITY)
Admission: RE | Disposition: A | Discharge status: Home / Self Care | Payer: Self-pay | Source: Ambulatory Visit | Attending: General Surgery

## 2015-04-10 ENCOUNTER — Ambulatory Visit (HOSPITAL_COMMUNITY)
Admission: RE | Admit: 2015-04-10 | Discharge: 2015-04-10 | Disposition: A | Discharge status: Home / Self Care | Payer: Commercial Managed Care - HMO | Source: Ambulatory Visit | Attending: General Surgery | Admitting: General Surgery

## 2015-04-10 ENCOUNTER — Ambulatory Visit (HOSPITAL_COMMUNITY): Payer: Commercial Managed Care - HMO | Admitting: Anesthesiology

## 2015-04-10 DIAGNOSIS — Z79899 Other long term (current) drug therapy: Secondary | ICD-10-CM | POA: Insufficient documentation

## 2015-04-10 DIAGNOSIS — K4090 Unilateral inguinal hernia, without obstruction or gangrene, not specified as recurrent: Secondary | ICD-10-CM | POA: Insufficient documentation

## 2015-04-10 DIAGNOSIS — Z87891 Personal history of nicotine dependence: Secondary | ICD-10-CM | POA: Insufficient documentation

## 2015-04-10 DIAGNOSIS — F419 Anxiety disorder, unspecified: Secondary | ICD-10-CM | POA: Insufficient documentation

## 2015-04-10 DIAGNOSIS — D176 Benign lipomatous neoplasm of spermatic cord: Secondary | ICD-10-CM | POA: Insufficient documentation

## 2015-04-10 DIAGNOSIS — K219 Gastro-esophageal reflux disease without esophagitis: Secondary | ICD-10-CM | POA: Insufficient documentation

## 2015-04-10 HISTORY — PX: INSERTION OF MESH: SHX5868

## 2015-04-10 HISTORY — PX: INGUINAL HERNIA REPAIR: SHX194

## 2015-04-10 SURGERY — REPAIR, HERNIA, INGUINAL, LAPAROSCOPIC
Anesthesia: General | Laterality: Left

## 2015-04-10 MED ORDER — FENTANYL CITRATE (PF) 100 MCG/2ML IJ SOLN
INTRAMUSCULAR | Status: DC | PRN
  Administered 2015-04-10 (×4): 50 ug via INTRAVENOUS

## 2015-04-10 MED ORDER — SODIUM CHLORIDE 0.9 % IV SOLN: 250.0000 mL | INTRAVENOUS | Status: DC | PRN

## 2015-04-10 MED ORDER — DEXAMETHASONE SODIUM PHOSPHATE 10 MG/ML IJ SOLN
INTRAMUSCULAR | Status: DC | PRN
  Administered 2015-04-10: 10 mg via INTRAVENOUS

## 2015-04-10 MED ORDER — ROCURONIUM BROMIDE 100 MG/10ML IV SOLN
INTRAVENOUS | Status: DC | PRN
  Administered 2015-04-10: 40 mg via INTRAVENOUS
  Administered 2015-04-10: 10 mg via INTRAVENOUS

## 2015-04-10 MED ORDER — PROPOFOL 10 MG/ML IV BOLUS
INTRAVENOUS | Status: DC | PRN
  Administered 2015-04-10: 150 mg via INTRAVENOUS

## 2015-04-10 MED ORDER — ONDANSETRON HCL 4 MG/2ML IJ SOLN
INTRAMUSCULAR | Status: DC | PRN
  Administered 2015-04-10: 4 mg via INTRAVENOUS

## 2015-04-10 MED ORDER — LACTATED RINGERS IV SOLN
INTRAVENOUS | Status: DC
  Administered 2015-04-10: 1000 mL via INTRAVENOUS

## 2015-04-10 MED ORDER — MIDAZOLAM HCL 2 MG/2ML IJ SOLN
INTRAMUSCULAR | Status: AC
  Filled 2015-04-10: qty 2

## 2015-04-10 MED ORDER — MEPERIDINE HCL 50 MG/ML IJ SOLN
6.2500 mg | INTRAMUSCULAR | Status: DC | PRN
Start: 2015-04-10 — End: 2015-04-10

## 2015-04-10 MED ORDER — ACETAMINOPHEN 325 MG PO TABS
650.0000 mg | ORAL_TABLET | ORAL | Status: DC | PRN
  Administered 2015-04-10: 650 mg via ORAL
  Filled 2015-04-10: qty 2

## 2015-04-10 MED ORDER — FENTANYL CITRATE (PF) 250 MCG/5ML IJ SOLN
INTRAMUSCULAR | Status: AC
  Filled 2015-04-10: qty 5

## 2015-04-10 MED ORDER — OXYCODONE HCL 5 MG PO TABS
5.0000 mg | ORAL_TABLET | ORAL | Status: DC | PRN
  Administered 2015-04-10 (×2): 5 mg via ORAL
  Filled 2015-04-10 (×2): qty 1

## 2015-04-10 MED ORDER — SUGAMMADEX SODIUM 200 MG/2ML IV SOLN
INTRAVENOUS | Status: AC
  Filled 2015-04-10: qty 2

## 2015-04-10 MED ORDER — BUPIVACAINE-EPINEPHRINE 0.25% -1:200000 IJ SOLN
INTRAMUSCULAR | Status: DC | PRN
  Administered 2015-04-10: 8 mL

## 2015-04-10 MED ORDER — ACETAMINOPHEN 650 MG RE SUPP
650.0000 mg | RECTAL | Status: DC | PRN
  Filled 2015-04-10: qty 1

## 2015-04-10 MED ORDER — OXYCODONE-ACETAMINOPHEN 5-325 MG PO TABS: 1.0000 | ORAL_TABLET | ORAL | Status: DC | PRN

## 2015-04-10 MED ORDER — HYDROMORPHONE HCL 1 MG/ML IJ SOLN: 0.2500 mg | INTRAMUSCULAR | Status: DC | PRN

## 2015-04-10 MED ORDER — ROCURONIUM BROMIDE 100 MG/10ML IV SOLN
INTRAVENOUS | Status: AC
  Filled 2015-04-10: qty 1

## 2015-04-10 MED ORDER — ONDANSETRON HCL 4 MG/2ML IJ SOLN
INTRAMUSCULAR | Status: AC
  Filled 2015-04-10: qty 2

## 2015-04-10 MED ORDER — PROMETHAZINE HCL 25 MG/ML IJ SOLN: 6.2500 mg | INTRAMUSCULAR | Status: DC | PRN

## 2015-04-10 MED ORDER — PROPOFOL 10 MG/ML IV BOLUS
INTRAVENOUS | Status: AC
  Filled 2015-04-10: qty 20

## 2015-04-10 MED ORDER — LIDOCAINE HCL (CARDIAC) 20 MG/ML IV SOLN
INTRAVENOUS | Status: DC | PRN
  Administered 2015-04-10: 100 mg via INTRAVENOUS

## 2015-04-10 MED ORDER — MORPHINE SULFATE (PF) 10 MG/ML IV SOLN: 2.0000 mg | INTRAVENOUS | Status: DC | PRN

## 2015-04-10 MED ORDER — BUPIVACAINE-EPINEPHRINE (PF) 0.25% -1:200000 IJ SOLN
INTRAMUSCULAR | Status: AC
  Filled 2015-04-10: qty 30

## 2015-04-10 MED ORDER — DEXAMETHASONE SODIUM PHOSPHATE 10 MG/ML IJ SOLN
INTRAMUSCULAR | Status: AC
  Filled 2015-04-10: qty 1

## 2015-04-10 MED ORDER — MIDAZOLAM HCL 5 MG/5ML IJ SOLN
INTRAMUSCULAR | Status: DC | PRN
  Administered 2015-04-10: 2 mg via INTRAVENOUS

## 2015-04-10 MED ORDER — SODIUM CHLORIDE 0.9% FLUSH: 3.0000 mL | Freq: Two times a day (BID) | INTRAVENOUS | Status: DC

## 2015-04-10 MED ORDER — CEFAZOLIN SODIUM-DEXTROSE 2-3 GM-% IV SOLR
INTRAVENOUS | Status: AC
  Filled 2015-04-10: qty 50

## 2015-04-10 MED ORDER — SUGAMMADEX SODIUM 200 MG/2ML IV SOLN
INTRAVENOUS | Status: DC | PRN
  Administered 2015-04-10: 200 mg via INTRAVENOUS

## 2015-04-10 MED ORDER — LIDOCAINE HCL (CARDIAC) 20 MG/ML IV SOLN
INTRAVENOUS | Status: AC
  Filled 2015-04-10: qty 5

## 2015-04-10 MED ORDER — CEFAZOLIN SODIUM-DEXTROSE 2-3 GM-% IV SOLR
2.0000 g | INTRAVENOUS | Status: AC
  Administered 2015-04-10: 2 g via INTRAVENOUS

## 2015-04-10 MED ORDER — SUCCINYLCHOLINE CHLORIDE 20 MG/ML IJ SOLN
INTRAMUSCULAR | Status: DC | PRN
  Administered 2015-04-10: 100 mg via INTRAVENOUS

## 2015-04-10 MED ORDER — MIDAZOLAM HCL 2 MG/2ML IJ SOLN: 0.5000 mg | Freq: Once | INTRAMUSCULAR | Status: DC | PRN

## 2015-04-10 MED ORDER — SODIUM CHLORIDE 0.9% FLUSH: 3.0000 mL | INTRAVENOUS | Status: DC | PRN

## 2015-04-10 MED ORDER — CHLORHEXIDINE GLUCONATE 4 % EX LIQD: 1.0000 "application " | Freq: Once | CUTANEOUS | Status: DC

## 2015-04-10 MED FILL — OXYCODONE/APAP 5/325MG: 5-325 | 3 days supply | Qty: 30 | Fill #0

## 2015-04-10 SURGICAL SUPPLY — 41 items
APL SKNCLS STERI-STRIP NONHPOA (GAUZE/BANDAGES/DRESSINGS) ×1
APPLIER CLIP 5 13 M/L LIGAMAX5 (MISCELLANEOUS) ×2
APR CLP MED LRG 5 ANG JAW (MISCELLANEOUS) ×1
BAG URINE DRAINAGE (UROLOGICAL SUPPLIES) ×2 IMPLANT
BENZOIN TINCTURE PRP APPL 2/3 (GAUZE/BANDAGES/DRESSINGS) ×2 IMPLANT
CABLE HIGH FREQUENCY MONO STRZ (ELECTRODE) IMPLANT
CATH FOLEY 3WAY 30CC 16FR (CATHETERS) ×2 IMPLANT
CHLORAPREP W/TINT 26ML (MISCELLANEOUS) ×2 IMPLANT
CLIP APPLIE 5 13 M/L LIGAMAX5 (MISCELLANEOUS) ×1 IMPLANT
COVER SURGICAL LIGHT HANDLE (MISCELLANEOUS) ×2 IMPLANT
DECANTER SPIKE VIAL GLASS SM (MISCELLANEOUS) ×2 IMPLANT
ELECT REM PT RETURN 9FT ADLT (ELECTROSURGICAL) ×2
ELECTRODE REM PT RTRN 9FT ADLT (ELECTROSURGICAL) ×1 IMPLANT
ENDOLOOP SUT PDS II  0 18 (SUTURE)
ENDOLOOP SUT PDS II 0 18 (SUTURE) ×1 IMPLANT
GAUZE SPONGE 2X2 8PLY STRL LF (GAUZE/BANDAGES/DRESSINGS) ×1 IMPLANT
GLOVE BIO SURGEON STRL SZ7.5 (GLOVE) ×2 IMPLANT
GOWN STRL REUS W/TWL XL LVL3 (GOWN DISPOSABLE) ×4 IMPLANT
KIT BASIN OR (CUSTOM PROCEDURE TRAY) ×2 IMPLANT
MESH 3DMAX 4X6 LT LRG (Mesh General) ×1 IMPLANT
NDL INSUFFLATION 14GA 120MM (NEEDLE) IMPLANT
NEEDLE INSUFFLATION 14GA 120MM (NEEDLE) IMPLANT
PLUG CATH AND CAP STER (CATHETERS) ×2 IMPLANT
RELOAD STAPLE 4.0 BLU F/HERNIA (INSTRUMENTS) ×1 IMPLANT
RELOAD STAPLE 4.8 BLK F/HERNIA (STAPLE) IMPLANT
RELOAD STAPLE HERNIA 4.0 BLUE (INSTRUMENTS) ×2 IMPLANT
RELOAD STAPLE HERNIA 4.8 BLK (STAPLE) IMPLANT
SCISSORS LAP 5X35 DISP (ENDOMECHANICALS) IMPLANT
SET IRRIG TUBING LAPAROSCOPIC (IRRIGATION / IRRIGATOR) IMPLANT
SET IRRIG Y TYPE TUR BLADDER L (SET/KITS/TRAYS/PACK) ×2 IMPLANT
SPONGE GAUZE 2X2 STER 10/PKG (GAUZE/BANDAGES/DRESSINGS) ×1
STAPLER HERNIA 12 8.5 360D (INSTRUMENTS) ×1 IMPLANT
STRIP CLOSURE SKIN 1/2X4 (GAUZE/BANDAGES/DRESSINGS) ×2 IMPLANT
SUT MNCRL AB 4-0 PS2 18 (SUTURE) ×2 IMPLANT
TOWEL OR 17X26 10 PK STRL BLUE (TOWEL DISPOSABLE) ×2 IMPLANT
TOWEL OR NON WOVEN STRL DISP B (DISPOSABLE) ×2 IMPLANT
TRAY FOLEY W/METER SILVER 14FR (SET/KITS/TRAYS/PACK) ×1 IMPLANT
TRAY FOLEY W/METER SILVER 16FR (SET/KITS/TRAYS/PACK) ×2 IMPLANT
TRAY LAPAROSCOPIC (CUSTOM PROCEDURE TRAY) ×2 IMPLANT
TROCAR CANNULA W/PORT DUAL 5MM (MISCELLANEOUS) ×2 IMPLANT
TROCAR XCEL 12X100 BLDLESS (ENDOMECHANICALS) ×2 IMPLANT

## 2015-04-10 NOTE — Interval H&P Note (Signed)
History and Physical Interval Note:  04/10/2015 8:01 AM  James Gallagher  has presented today for surgery, with the diagnosis of LEFT INGUINAL HERNIA  The various methods of treatment have been discussed with the patient and family. After consideration of risks, benefits and other options for treatment, the patient has consented to  Procedure(s): LAPAROSCOPIC POSSIBLE OPEN LEFT  INGUINAL HERNIA REPAIR WITH MESH (Left) INSERTION OF MESH (Left) as a surgical intervention .  The patient's history has been reviewed, patient examined, no change in status, stable for surgery.  I have reviewed the patient's chart and labs.  Questions were answered to the patient's satisfaction.     Rosario Jacks., Anne Hahn

## 2015-04-10 NOTE — Op Note (Signed)
04/10/2015  9:21 AM  PATIENT:  James Gallagher  35 y.o. male  PRE-OPERATIVE DIAGNOSIS:  LEFT INGUINAL HERNIA  POST-OPERATIVE DIAGNOSIS:  LEFT INDIRECT INGUINAL HERNIA  PROCEDURE:  Procedure(s): LAPAROSCOPIC  LEFT  INGUINAL HERNIA REPAIR WITH MESH (Left) INSERTION OF MESH (Left)  SURGEON:  Surgeon(s) and Role:    * Ralene Ok, MD - Primary   ANESTHESIA:   local and general  EBL:   <5CC  BLOOD ADMINISTERED:none  DRAINS: none   LOCAL MEDICATIONS USED:  BUPIVICAINE   SPECIMEN:  No Specimen  DISPOSITION OF SPECIMEN:  N/A  COUNTS:  YES  TOURNIQUET:  * No tourniquets in log *  DICTATION: .Dragon Dictation   Counts: reported as correct x 2  Findings:  The patient had a largeleft indirect hernia  Indications for procedure:  The patient is a 35 year old male with a left inguinal hernia for several months. Patient complained of symptomatology to his left inguinal area. The patient was taken back for elective inguinal hernia repair.  Details of the procedure: The patient was taken back to the operating room. The patient was placed in supine position with bilateral SCDs in place.  The patient was prepped and draped in the usual sterile fashion.  After appropriate anitbiotics were confirmed, a time-out was confirmed and all facts were verified.  0.25% Marcaine was used to infiltrate the umbilical area. A 11-blade was used to cut down the skin and blunt dissection was used to get the anterior fashion.  The anterior fascia was incised approximately 1 cm and the muscles were retracted laterally. Blunt dissection was then used to create a space in the preperitoneal area. At this time a 10 mm camera was then introduced into the space and advanced the pubic tubercle and a 12 mm trocar was placed over this and insufflation was started.  At this time and space was created from medial to laterally the preperitoneal space.  Cooper's ligament was initially cleaned off.  The hernia sac was  identified in the indirect space. Dissection of the hernia sac was undertaken the vas deferens was identified and protected in all parts of the case.    Once the hernia sac and cord lipoma were taken down to approximately the umbilicus a Bard 3D Max mesh, size: Large, was  introduced into the preperitoneal space.  The mesh was brought over to cover the direct and indirect hernia spaces.  This was anchored into place and secured to Cooper's ligament with 4.15mm staples from a [place] hernia stapler. It was anchored to the anterior abdominal wall with 4.8 mm staples. The hernia sac was seen lying posterior to the mesh. There was no staples placed laterally. The insufflation was evacuated and the peritoneum was seen posterior to the mesh. The trochars were removed. The anterior fascia was reapproximated using #1 Vicryl on a UR- 6.  Intra-abdominal air was evacuated and the Veress needle removed. The skin was reapproximated using 4-0 Monocryl subcuticular fashion the patient was awakened from general anesthesia and taken to recovery in stable condition.   PLAN OF CARE: Discharge to home after PACU  PATIENT DISPOSITION:  PACU - hemodynamically stable.   Delay start of Pharmacological VTE agent (>24hrs) due to surgical blood loss or risk of bleeding: not applicable

## 2015-04-10 NOTE — Anesthesia Procedure Notes (Signed)
Procedure Name: Intubation Date/Time: 04/10/2015 8:17 AM Performed by: Lind Covert Pre-anesthesia Checklist: Patient identified, Emergency Drugs available, Suction available, Patient being monitored and Timeout performed Patient Re-evaluated:Patient Re-evaluated prior to inductionOxygen Delivery Method: Circle system utilized Preoxygenation: Pre-oxygenation with 100% oxygen Intubation Type: IV induction and Cricoid Pressure applied Laryngoscope Size: Mac and 4 Grade View: Grade I Tube type: Oral Tube size: 7.5 mm Airway Equipment and Method: Stylet Placement Confirmation: ETT inserted through vocal cords under direct vision,  positive ETCO2 and breath sounds checked- equal and bilateral Secured at: 22 cm Tube secured with: Tape Dental Injury: Teeth and Oropharynx as per pre-operative assessment

## 2015-04-10 NOTE — Transfer of Care (Signed)
Immediate Anesthesia Transfer of Care Note  Patient: James Gallagher  Procedure(s) Performed: Procedure(s): LAPAROSCOPIC POSSIBLE OPEN LEFT  INGUINAL HERNIA REPAIR WITH MESH (Left) INSERTION OF MESH (Left)  Patient Location: PACU  Anesthesia Type:General  Level of Consciousness: sedated  Airway & Oxygen Therapy: Patient Spontanous Breathing and Patient connected to face mask oxygen  Post-op Assessment: Report given to RN and Post -op Vital signs reviewed and stable  Post vital signs: Reviewed and stable  Last Vitals:  Filed Vitals:   04/10/15 0641  BP: 114/64  Pulse: 75  Temp: 36.6 C  Resp: 18    Complications: No apparent anesthesia complications

## 2015-04-10 NOTE — Progress Notes (Signed)
Patient OOB to BR. Ambulated in hallway from 1301 to 1309. Back to room. Patient states abdominal pain is still a 7. Given another 5 mg of oxycodone IR.

## 2015-04-10 NOTE — H&P (View-Only) (Signed)
History of Present Illness Ralene Ok MD; 01/23/2015 9:22 AM) The patient is a 35 year old male who presents with an inguinal hernia. The patient is a 35 year old male who is referred by Dr. Ricardo Jericho for evaluation of a left inguinal hernia. The patient states she's had this hernia for over 2 years. 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 He would like to have this repaired.     Other Problems Elbert Ewings, CMA; 01/23/2015 9:07 AM) Anxiety Disorder Inguinal Hernia  Past Surgical History Elbert Ewings, [place]; 01/23/2015 9:07 AM) Oral Surgery  Diagnostic Studies History Elbert Ewings, [place]; 01/23/2015 9:07 AM) Colonoscopy never  Allergies Elbert Ewings, CMA; 01/23/2015 9:07 AM) No Known Drug Allergies11/11/2014  Medication History Elbert Ewings, CMA; 01/23/2015 9:08 AM) ClonazePAM (0.5MG  Tablet, Oral as needed) Active. Medications Reconciled    Review of Systems Elbert Ewings CMA; 01/23/2015 9:07 AM) General Not Present- Appetite Loss, Chills, Fatigue, Fever, Night Sweats, Weight Gain and Weight Loss. Skin Not Present- Change in Wart/Mole, Dryness, Hives, Jaundice, New Lesions, Non-Healing Wounds, Rash and Ulcer. HEENT Present- Seasonal Allergies. Not Present- Earache, Hearing Loss, Hoarseness, Nose Bleed, Oral Ulcers, Ringing in the Ears, Sinus Pain, Sore Throat, Visual Disturbances, Wears glasses/contact lenses and Yellow Eyes. Respiratory Not Present- Bloody sputum, Chronic Cough, Difficulty Breathing, Snoring and Wheezing. Breast Not Present- Breast Mass, Breast Pain, Nipple Discharge and Skin Changes. Cardiovascular Not Present- Chest Pain, Difficulty Breathing Lying Down, Leg Cramps, Palpitations, Rapid Heart Rate, Shortness of Breath and Swelling of Extremities. Gastrointestinal Present- Abdominal Pain. Not Present- Bloating, Bloody Stool, Change  in Bowel Habits, Chronic diarrhea, Constipation, Difficulty Swallowing, Excessive gas, Gets full quickly at meals, Hemorrhoids, Indigestion, Nausea, Rectal Pain and Vomiting. Male Genitourinary Present- Change in Urinary Stream. Not Present- Blood in Urine, Frequency, Impotence, Nocturia, Painful Urination, Urgency and Urine Leakage. Musculoskeletal Not Present- Back Pain, Joint Pain, Joint Stiffness, Muscle Pain, Muscle Weakness and Swelling of Extremities. Neurological Not Present- Decreased Memory, Fainting, Headaches, Numbness, Seizures, Tingling, Tremor, Trouble walking and Weakness. Psychiatric Present- Anxiety. Not Present- Bipolar, Change in Sleep Pattern, Depression, Fearful and Frequent crying. Endocrine Not Present- Cold Intolerance, Excessive Hunger, Hair Changes, Heat Intolerance, Hot flashes and New Diabetes. Hematology Not Present- Easy Bruising, Excessive bleeding, Gland problems, HIV and Persistent Infections.  Vitals Elbert Ewings CMA; 01/23/2015 9:08 AM) 01/23/2015 9:08 AM Weight: 162.8 lb Height: 73in Body Surface Area: 1.97 m Body Mass Index: 21.48 kg/m  Temp.: 98.83F(Temporal)  Pulse: 78 (Regular)  BP: 128/70 (Sitting, Left Arm, Standard)       Physical Exam Ralene Ok, MD; 01/23/2015 9:23 AM) General Mental Status-Alert. General Appearance-Consistent with stated age. Hydration-Well hydrated. Voice-Normal.  Head and Neck Head-normocephalic, atraumatic with no lesions or palpable masses. Trachea-midline.  Eye Eyeball - Bilateral-Extraocular movements intact. Sclera/Conjunctiva - Bilateral-No scleral icterus.  Chest and Lung Exam Chest and lung exam reveals -quiet, even and easy respiratory effort with no use of accessory muscles. Inspection Chest Wall - Normal. Back - normal.  Cardiovascular Cardiovascular examination reveals -normal heart sounds, regular rate and rhythm with no murmurs.  Abdomen Inspection Skin -  Scar - no surgical scars. Hernias - Inguinal hernia - Left - Reducible. Palpation/Percussion Normal exam - Soft, Non Tender, No Rebound tenderness, No Rigidity (guarding) and No hepatosplenomegaly. Auscultation Normal exam - Bowel sounds normal.  Neurologic Neurologic evaluation reveals -alert and oriented x 3 with no impairment of  recent or remote memory. Mental Status-Normal.  Musculoskeletal Normal Exam - Left-Upper Extremity Strength Normal and Lower Extremity Strength Normal. Normal Exam - Right-Upper Extremity Strength Normal, Lower Extremity Weakness.    Assessment & Plan Ralene Ok MD; 01/23/2015 9:23 AM) LEFT INGUINAL HERNIA (K40.90) Impression: 35 year old male with a left, large inguinal hernia.  1. The patient will like to proceed to the operating room for laparoscopic left inguinal hernia repair, possible open.  2. I discussed with the patient the signs and symptoms of incarceration and strangulation and the need to proceed to the ER should they occur.  3. I discussed with the patient the risks and benefits of the procedure to include but not limited to: Infection, bleeding, damage to surrounding structures, possible need for further surgery, possible nerve pain, and possible recurrence. The patient was understanding and wishes to proceed.

## 2015-04-10 NOTE — Discharge Instructions (Signed)
[facility] _______[facility] ° °INGUINAL HERNIA REPAIR: POST OP INSTRUCTIONS ° °Always review your discharge instruction sheet given to you by the facility where your surgery was performed. °IF YOU HAVE DISABILITY OR FAMILY LEAVE FORMS, YOU MUST BRING THEM TO THE OFFICE FOR PROCESSING.   °DO NOT GIVE THEM TO YOUR DOCTOR. ° °1. A  prescription for pain medication may be given to you upon discharge.  Take your pain medication as prescribed, if needed.  If narcotic pain medicine is not needed, then you may take acetaminophen (Tylenol) or ibuprofen (Advil) as needed. °2. Take your usually prescribed medications unless otherwise directed. °3. If you need a refill on your pain medication, please contact your pharmacy.  They will contact our office to request authorization. Prescriptions will not be filled after 5 pm or on week-ends. °4. You should follow a light diet the first 24 hours after arrival home, such as soup and crackers, etc.  Be sure to include lots of fluids daily.  Resume your normal diet the day after surgery. °5. Most patients will experience some swelling and bruising around the umbilicus or in the groin and scrotum.  Ice packs and reclining will help.  Swelling and bruising can take several days to resolve.  °6. It is common to experience some constipation if taking pain medication after surgery.  Increasing fluid intake and taking a stool softener (such as Colace) will usually help or prevent this problem from occurring.  A mild laxative (Milk of Magnesia or Miralax) should be taken according to package directions if there are no bowel movements after 48 hours. °7. Unless discharge instructions indicate otherwise, you may remove your bandages 24-48 hours after surgery, and you may shower at that time.  You may have steri-strips (small skin tapes) in place directly over the incision.  These strips should be left on the skin for 7-10 days.  If your surgeon used skin glue on the incision, you  may shower in 24 hours.  The glue will flake off over the next 2-3 weeks.  Any sutures or staples will be removed at the office during your follow-up visit. °8. ACTIVITIES:  You may resume regular (light) daily activities beginning the next day--such as daily self-care, walking, climbing stairs--gradually increasing activities as tolerated.  You may have sexual intercourse when it is comfortable.  Refrain from any heavy lifting or straining until approved by your doctor. °a. You may drive when you are no longer taking prescription pain medication, you can comfortably wear a seatbelt, and you can safely maneuver your car and apply brakes. °b. RETURN TO WORK:  __________________________________________________________ °9. You should see your doctor in the office for a follow-up appointment approximately 2-3 weeks after your surgery.  Make sure that you call for this appointment within a day or two after you arrive home to insure a convenient appointment time. °10. OTHER INSTRUCTIONS:  __________________________________________________________________________________________________________________________________________________________________________________________  °WHEN TO CALL YOUR DOCTOR: °1. Fever over 101.0 °2. Inability to urinate °3. Nausea and/or vomiting °4. Extreme swelling or bruising °5. Continued bleeding from incision. °6. Increased pain, redness, or drainage from the incision ° °The clinic staff is available to answer your questions during regular business hours.  Please don’t hesitate to call and ask to speak to one of the nurses for clinical concerns.  If you have a medical emergency, go to the nearest emergency room or call 911.  A surgeon from [facility] is always on call at the hospital ° ° °[address], [address], [place], [place]  [address] ? ° [address] [address], [place], [place]   [address] °[phone] ? [phone] ? FAX [phone] °Web site:  [email or web address] ° ° ° ° °General Anesthesia, Adult, Care After °Refer to this sheet in the next few weeks. These instructions provide you with information on caring for yourself after your procedure. Your health care provider may also give you more specific instructions. Your treatment has been planned according to current medical practices, but problems sometimes occur. Call your health care provider if you have any problems or questions after your procedure. °WHAT TO EXPECT AFTER THE PROCEDURE °After the procedure, it is typical to experience: °· Sleepiness. °· Nausea and vomiting. °HOME CARE INSTRUCTIONS °· For the first 24 hours after general anesthesia: °¨ Have a responsible person with you. °¨ Do not drive a car. If you are alone, do not take public transportation. °¨ Do not drink alcohol. °¨ Do not take medicine that has not been prescribed by your health care provider. °¨ Do not sign important papers or make important decisions. °¨ You may resume a normal diet and activities as directed by your health care provider. °· Change bandages (dressings) as directed. °· If you have questions or problems that seem related to general anesthesia, call the hospital and ask for the anesthetist or anesthesiologist on call. °SEEK MEDICAL CARE IF: °· You have nausea and vomiting that continue the day after anesthesia. °· You develop a rash. °SEEK IMMEDIATE MEDICAL CARE IF:  °· You have difficulty breathing. °· You have chest pain. °· You have any allergic problems. °  °This information is not intended to replace advice given to you by your health care provider. Make sure you discuss any questions you have with your health care provider. °  °Document Released: 06/08/2000 Document Revised: 03/23/2014 Document Reviewed: 07/01/2011 °Elsevier Interactive Patient Education ©2016 [employer] ° °

## 2015-04-10 NOTE — Anesthesia Preprocedure Evaluation (Addendum)
Anesthesia Evaluation  Patient identified by MRN, date of birth, ID band Patient awake    Reviewed: Allergy & Precautions, NPO status , Patient's Chart, lab work & pertinent test results  History of Anesthesia Complications Negative for: history of anesthetic complications  Airway Mallampati: II  TM Distance: >3 FB Neck ROM: Full    Dental  (+) Dental Advisory Given   Pulmonary Current Smoker (MJ), former smoker (quit 2014),    breath sounds clear to auscultation       Cardiovascular negative cardio ROS   Rhythm:Regular Rate:Normal     Neuro/Psych Anxiety    GI/Hepatic GERD  Controlled and Medicated,(+)     substance abuse  marijuana use,   Endo/Other  negative endocrine ROS  Renal/GU negative Renal ROS     Musculoskeletal   Abdominal   Peds  Hematology negative hematology ROS (+)   Anesthesia Other Findings   Reproductive/Obstetrics                            Anesthesia Physical Anesthesia Plan  ASA: II  Anesthesia Plan: General   Post-op Pain Management:    Induction: Intravenous  Airway Management Planned: Oral ETT  Additional Equipment:   Intra-op Plan:   Post-operative Plan: Extubation in OR  Informed Consent: I have reviewed the patients History and Physical, chart, labs and discussed the procedure including the risks, benefits and alternatives for the proposed anesthesia with the patient or authorized representative who has indicated his/her understanding and acceptance.   Dental advisory given  Plan Discussed with: CRNA and Surgeon  Anesthesia Plan Comments: (Plan routine monitors, GETA)        Anesthesia Quick Evaluation

## 2015-04-10 NOTE — Anesthesia Postprocedure Evaluation (Signed)
Anesthesia Post Note  Patient: HAIDAN RIN  Procedure(s) Performed: Procedure(s) (LRB): LAPAROSCOPIC POSSIBLE OPEN LEFT  INGUINAL HERNIA REPAIR WITH MESH (Left) INSERTION OF MESH (Left)  Patient location during evaluation: PACU Anesthesia Type: General Level of consciousness: awake and alert, oriented and patient cooperative Pain management: pain level controlled Vital Signs Assessment: post-procedure vital signs reviewed and stable Respiratory status: spontaneous breathing, nonlabored ventilation and respiratory function stable Cardiovascular status: blood pressure returned to baseline and stable Postop Assessment: no signs of nausea or vomiting Anesthetic complications: no    Last Vitals:  Filed Vitals:   04/10/15 1026 04/10/15 1129  BP: 104/67 115/71  Pulse: 51 52  Temp: 36.4 C 36.7 C  Resp: 15 16    Last Pain:  Filed Vitals:   04/10/15 1221  PainSc: 3                  Dejai Schubach,E. Jonathon Castelo

## 2015-04-18 ENCOUNTER — Ambulatory Visit (INDEPENDENT_AMBULATORY_CARE_PROVIDER_SITE_OTHER): Payer: Commercial Managed Care - HMO | Admitting: Family Medicine

## 2015-04-18 ENCOUNTER — Encounter: Payer: Self-pay | Admitting: Family Medicine

## 2015-04-18 VITALS — BP 112/70 | HR 71 | Temp 98.1°F | Resp 16 | Ht 73.0 in | Wt 170.8 lb

## 2015-04-18 DIAGNOSIS — K2100 Gastro-esophageal reflux disease with esophagitis, without bleeding: Secondary | ICD-10-CM

## 2015-04-18 DIAGNOSIS — J387 Other diseases of larynx: Secondary | ICD-10-CM

## 2015-04-18 DIAGNOSIS — K219 Gastro-esophageal reflux disease without esophagitis: Secondary | ICD-10-CM

## 2015-04-18 DIAGNOSIS — Z23 Encounter for immunization: Secondary | ICD-10-CM

## 2015-04-18 MED ORDER — OMEPRAZOLE 40 MG PO CPDR: DELAYED_RELEASE_CAPSULE | ORAL | Status: DC

## 2015-04-18 NOTE — Progress Notes (Signed)
OFFICE VISIT  04/18/2015   CC:  Chief Complaint  Patient presents with  . Cough    clearing throat x 1 year   HPI:    Patient is a 35 y.o. Caucasian male who presents for "always clearing throat". Estimates onset about a year ago, says he clears his throat all the time, spits a lot, finds it very annoying.  Mornings he will cough up some "sinus stuff".  Says he has acid reflux "terribly" all the time, worse first thing in the morning.  He takes zantac 2-3 times per week.  He is trying to alter diet some but likes spicy food.  Appetite is good.  No dysphagia.  No loss of voice or distinct hoarseness but occ crackly.  No signif cough.  No ST.  Past Medical History  Diagnosis Date  . Seasonal allergies   . Shortness of breath dyspnea     with exertion  . Rash     "bumps on my neck"  . Anxiety     Past Surgical History  Procedure Laterality Date  . Wisdom tooth extraction    . Inguinal hernia repair Left 04/10/2015    Procedure: LAPAROSCOPIC  LEFT  INGUINAL HERNIA REPAIR WITH MESH;  Surgeon: Ralene Ok, MD;  Location: [facility];  Service: General;  Laterality: Left;  . Insertion of mesh Left 04/10/2015    Procedure: INSERTION OF MESH;  Surgeon: Ralene Ok, MD;  Location: [facility];  Service: General;  Laterality: Left;    Outpatient Prescriptions Prior to Visit  Medication Sig Dispense Refill  . clonazePAM (KLONOPIN) 0.5 MG tablet 1-2 tabs po bid prn severe anxiety 10 tablet 1  . oxyCODONE-acetaminophen (ROXICET) 5-325 MG tablet Take 1-2 tablets by mouth every 4 (four) hours as needed. 30 tablet 0  . tetrahydrozoline 0.05 % ophthalmic solution Place 1 drop into both eyes 2 (two) times daily as needed (Allergies).     No facility-administered medications prior to visit.    No Known Allergies  ROS As per HPI  PE: Blood pressure 112/70, pulse 71, temperature 98.1 F (36.7 C), temperature source Oral, resp. rate 16, height 6\' 1"  (1.854 m), weight 170 lb 12 oz (77.452 kg),  SpO2 98 %. Gen: Alert, well appearing.  Patient is oriented to person, place, time, and situation. ENT: Ears: EACs clear, normal epithelium.  TMs with good light reflex and landmarks bilaterally.  Eyes: no injection, icteris, swelling, or exudate.  EOMI, PERRLA. Nose: no drainage or turbinate edema/swelling.  No injection or focal lesion.  Mouth: lips without lesion/swelling.  Oral mucosa pink and moist.  Dentition intact and without obvious caries or gingival swelling.  Oropharynx without erythema, exudate, or swelling.  Neck - No masses or thyromegaly or limitation in range of motion CV: RRR, no m/r/g.   LUNGS: CTA bilat, nonlabored resps, good aeration in all lung fields.   LABS:  none  IMPRESSION AND PLAN:  1) Laryngopharyngeal reflux and GERD. Discussed elevation of head of bed with 2X 4 or brick. Discussed GER diet/handout given. Start daily omeprazole 40mg  qAM.  2) Prev health care: Tdap and flu vaccine given today.  An After Visit Summary was printed and given to the patient.  FOLLOW UP: Return in about 4 weeks (around 05/16/2015) for f/u GERD.

## 2015-04-18 NOTE — Addendum Note (Signed)
Addended by: Onalee Hua on: 04/18/2015 11:45 AM   Modules accepted: Orders

## 2015-04-18 NOTE — Progress Notes (Signed)
Pre visit review using our clinic review tool, if applicable. No additional management support is needed unless otherwise documented below in the visit note. 

## 2015-05-06 ENCOUNTER — Other Ambulatory Visit: Payer: Self-pay | Admitting: *Deleted

## 2015-05-06 MED ORDER — CLONAZEPAM 0.5 MG PO TABS: ORAL_TABLET | ORAL | Status: DC

## 2015-05-06 NOTE — Telephone Encounter (Signed)
Pt LMOM on 05/06/15 at 11:34am requesting refills.   RF request for clonazepam LOV: 12/05/14 Next ov: 05/15/15 Last written: 01/11/15 #10 w/ 1RF  Please advise.Thanks.

## 2015-05-06 NOTE — Telephone Encounter (Signed)
Rx faxed.   Pt advised and voiced understanding.   

## 2015-05-15 ENCOUNTER — Ambulatory Visit: Payer: Commercial Managed Care - HMO | Admitting: Family Medicine

## 2015-07-11 ENCOUNTER — Other Ambulatory Visit: Payer: Self-pay | Admitting: *Deleted

## 2015-07-11 MED ORDER — CLONAZEPAM 0.5 MG PO TABS: ORAL_TABLET | ORAL | Status: DC

## 2015-07-11 NOTE — Telephone Encounter (Signed)
Pt LMOM on 07/11/15 at 8:36am requesting refill. He stated that he is out of this medication.   RF request for clonazepam LOV: 12/05/14 (f/u 6 weeks) Next ov: None Last written: 05/06/15 #20 w/ 1RF  Please advise. Thanks.

## 2015-07-11 NOTE — Telephone Encounter (Signed)
Rx faxed.   Pt advised and voiced understanding.   

## 2015-08-26 ENCOUNTER — Other Ambulatory Visit: Payer: Self-pay | Admitting: *Deleted

## 2015-08-26 MED ORDER — OMEPRAZOLE 40 MG PO CPDR: DELAYED_RELEASE_CAPSULE | ORAL | Status: DC

## 2015-08-26 NOTE — Telephone Encounter (Signed)
RF request for omeprazole LOV: 04/18/15 Next ov: None Last written: 04/18/15 #30 w/ 3RF

## 2015-09-02 ENCOUNTER — Telehealth: Payer: Self-pay | Admitting: *Deleted

## 2015-09-02 MED ORDER — OMEPRAZOLE 40 MG PO CPDR: DELAYED_RELEASE_CAPSULE | ORAL | Status: DC

## 2015-09-02 NOTE — Telephone Encounter (Signed)
RF request for omeprazole LOV: 04/18/15  Next ov: None -  Over due for f/u on GERD Last written: 08/26/15 #30 w/ 3RF  Rx sent for #30 w/ 0RF. Pt needs office visit for more refills.

## 2015-09-02 NOTE — Telephone Encounter (Signed)
Pt advised and voiced understanding.  Apt made for 09/20/15 at 8:00am.

## 2015-09-20 ENCOUNTER — Ambulatory Visit (INDEPENDENT_AMBULATORY_CARE_PROVIDER_SITE_OTHER): Payer: Commercial Managed Care - HMO | Admitting: Family Medicine

## 2015-09-20 ENCOUNTER — Encounter: Payer: Self-pay | Admitting: Family Medicine

## 2015-09-20 VITALS — BP 95/59 | HR 50 | Temp 98.1°F | Resp 16 | Ht 73.0 in | Wt 162.5 lb

## 2015-09-20 DIAGNOSIS — R0989 Other specified symptoms and signs involving the circulatory and respiratory systems: Secondary | ICD-10-CM

## 2015-09-20 DIAGNOSIS — R6889 Other general symptoms and signs: Secondary | ICD-10-CM

## 2015-09-20 DIAGNOSIS — J387 Other diseases of larynx: Secondary | ICD-10-CM | POA: Diagnosis not present

## 2015-09-20 DIAGNOSIS — R198 Other specified symptoms and signs involving the digestive system and abdomen: Secondary | ICD-10-CM | POA: Diagnosis not present

## 2015-09-20 DIAGNOSIS — K219 Gastro-esophageal reflux disease without esophagitis: Secondary | ICD-10-CM

## 2015-09-20 MED ORDER — OMEPRAZOLE 40 MG PO CPDR: DELAYED_RELEASE_CAPSULE | ORAL | Status: DC

## 2015-09-20 NOTE — Progress Notes (Signed)
Pre visit review using our clinic review tool, if applicable. No additional management support is needed unless otherwise documented below in the visit note. 

## 2015-09-20 NOTE — Progress Notes (Signed)
OFFICE VISIT  09/20/2015   CC:  Chief Complaint  Patient presents with  . Follow-up    GERD    HPI:    Patient is a 35 y.o. Caucasian male who presents for f/u GERD/LPR. Says GER is much improved. Still with lots of clearing of his throat, some dry throat feeling.  He is worried something else may be going on. No cough, just some forceful clearing of throat.  No ST.  Occ feels hoarse but never loses his voice. Always feels like something is in his throat. Denies PND, denies frequent sneezing.  No nasal congestion.    Past Medical History  Diagnosis Date  . Seasonal allergies   . Shortness of breath dyspnea     with exertion  . Rash     "bumps on my neck"  . Anxiety     Past Surgical History  Procedure Laterality Date  . Wisdom tooth extraction    . Inguinal hernia repair Left 04/10/2015    Procedure: LAPAROSCOPIC  LEFT  INGUINAL HERNIA REPAIR WITH MESH;  Surgeon: Ralene Ok, MD;  Location: [facility];  Service: General;  Laterality: Left;  . Insertion of mesh Left 04/10/2015    Procedure: INSERTION OF MESH;  Surgeon: Ralene Ok, MD;  Location: [facility];  Service: General;  Laterality: Left;    Outpatient Prescriptions Prior to Visit  Medication Sig Dispense Refill  . clonazePAM (KLONOPIN) 0.5 MG tablet 1-2 tabs po bid prn severe anxiety 20 tablet 1  . tetrahydrozoline 0.05 % ophthalmic solution Place 1 drop into both eyes 2 (two) times daily as needed (Allergies).    Marland Kitchen omeprazole (PRILOSEC) 40 MG capsule 1 cap po qAM 30 capsule 0  . oxyCODONE-acetaminophen (ROXICET) 5-325 MG tablet Take 1-2 tablets by mouth every 4 (four) hours as needed. (Patient not taking: Reported on 09/20/2015) 30 tablet 0   No facility-administered medications prior to visit.    No Known Allergies  ROS As per HPI  PE: Blood pressure 95/59, pulse 50, temperature 98.1 F (36.7 C), temperature source Oral, resp. rate 16, height 6\' 1"  (1.854 m), weight 162 lb 8 oz (73.71 kg), SpO2 96 %. Gen:  Alert, well appearing.  Patient is oriented to person, place, time, and situation. [id number]: no injection, icteris, swelling, or exudate.  EOMI, PERRLA. Mouth: lips without lesion/swelling.  Oral mucosa pink and moist. Oropharynx without erythema, exudate, or swelling.  Neck - No masses or thyromegaly or limitation in range of motion CV: RRR, no m/r/g.   LUNGS: CTA bilat, nonlabored resps, good aeration in all lung fields.   LABS:  None today  IMPRESSION AND PLAN:  GERD and LPR: improved GER symptoms but his chronic throat clearing/feeling of mucous in back of throat is persisting and really bothering him. Discussed options today and pt prefers referral to ENT for further evaluation so I ordered this today. RF'd omeprazole 40mg  qd.  An After Visit Summary was printed and given to the patient.  FOLLOW UP: Return in about 6 months (around 03/22/2016) for annual CPE (fasting).  Signed:  Crissie Sickles, MD           09/20/2015

## 2015-10-03 ENCOUNTER — Encounter: Payer: Self-pay | Admitting: Family Medicine

## 2015-10-09 ENCOUNTER — Other Ambulatory Visit: Payer: Self-pay | Admitting: *Deleted

## 2015-10-09 MED ORDER — CLONAZEPAM 0.5 MG PO TABS: ORAL_TABLET | ORAL | 2 refills | Status: DC

## 2015-10-09 NOTE — Telephone Encounter (Signed)
RF request for clonazepam LOV: 12/05/14 Next ov: 03/27/16 Last written: 07/11/15 #20 w/ 1RF  Please advise. Thanks.

## 2015-10-10 NOTE — Telephone Encounter (Signed)
Rx faxed

## 2015-10-28 ENCOUNTER — Other Ambulatory Visit: Payer: Self-pay | Admitting: *Deleted

## 2015-10-28 MED ORDER — CLONAZEPAM 0.5 MG PO TABS: ORAL_TABLET | ORAL | 2 refills | Status: DC

## 2015-10-28 NOTE — Telephone Encounter (Signed)
Rx faxed.   Pt advised and voiced understanding.   

## 2015-10-28 NOTE — Telephone Encounter (Signed)
Pt LMOM on 10/28/15 at 8:42am requesting refill.   RF request for clonapepam LOV: 12/05/14 Next ov: 03/27/16 Last written: 10/09/15 @20  w/ 2RF  Please advise. Thanks.

## 2016-03-26 NOTE — Progress Notes (Signed)
Pre visit review using our clinic review tool, if applicable. No additional management support is needed unless otherwise documented below in the visit note. 

## 2016-03-27 ENCOUNTER — Ambulatory Visit (INDEPENDENT_AMBULATORY_CARE_PROVIDER_SITE_OTHER): Payer: Commercial Managed Care - HMO | Admitting: Family Medicine

## 2016-03-27 ENCOUNTER — Encounter: Payer: Self-pay | Admitting: Family Medicine

## 2016-03-27 VITALS — BP 109/71 | HR 72 | Temp 98.3°F | Resp 16 | Ht 73.5 in | Wt 166.5 lb

## 2016-03-27 DIAGNOSIS — Z0000 Encounter for general adult medical examination without abnormal findings: Secondary | ICD-10-CM

## 2016-03-27 DIAGNOSIS — Z23 Encounter for immunization: Secondary | ICD-10-CM

## 2016-03-27 DIAGNOSIS — Z114 Encounter for screening for human immunodeficiency virus [HIV]: Secondary | ICD-10-CM | POA: Diagnosis not present

## 2016-03-27 LAB — CBC WITH DIFFERENTIAL/PLATELET
Basophils Absolute: 0 10*3/uL (ref 0.0–0.1)
Basophils Relative: 0.4 % (ref 0.0–3.0)
EOS ABS: 0.1 10*3/uL (ref 0.0–0.7)
EOS PCT: 1.7 % (ref 0.0–5.0)
HEMATOCRIT: 43.9 % (ref 39.0–52.0)
HEMOGLOBIN: 14.9 g/dL (ref 13.0–17.0)
LYMPHS PCT: 25.9 % (ref 12.0–46.0)
Lymphs Abs: 1.5 10*3/uL (ref 0.7–4.0)
MCHC: 34 g/dL (ref 30.0–36.0)
MCV: 93.3 fl (ref 78.0–100.0)
MONO ABS: 0.5 10*3/uL (ref 0.1–1.0)
Monocytes Relative: 8.6 % (ref 3.0–12.0)
Neutro Abs: 3.6 10*3/uL (ref 1.4–7.7)
Neutrophils Relative %: 63.4 % (ref 43.0–77.0)
Platelets: 229 10*3/uL (ref 150.0–400.0)
RBC: 4.71 Mil/uL (ref 4.22–5.81)
RDW: 13.4 % (ref 11.5–15.5)
WBC: 5.7 10*3/uL (ref 4.0–10.5)

## 2016-03-27 LAB — COMPREHENSIVE METABOLIC PANEL
ALBUMIN: 4.5 g/dL (ref 3.5–5.2)
ALK PHOS: 62 U/L (ref 39–117)
ALT: 14 U/L (ref 0–53)
AST: 18 U/L (ref 0–37)
BUN: 19 mg/dL (ref 6–23)
CALCIUM: 9.9 mg/dL (ref 8.4–10.5)
CO2: 31 mEq/L (ref 19–32)
CREATININE: 0.95 mg/dL (ref 0.40–1.50)
Chloride: 103 mEq/L (ref 96–112)
GFR: 97 mL/min (ref >60.00)
Glucose, Bld: 94 mg/dL (ref 70–99)
Potassium: 5.3 mEq/L — ABNORMAL HIGH (ref 3.5–5.1)
SODIUM: 139 meq/L (ref 135–145)
TOTAL PROTEIN: 6.7 g/dL (ref 6.0–8.3)
Total Bilirubin: 0.9 mg/dL (ref 0.2–1.2)

## 2016-03-27 LAB — LIPID PANEL
CHOLESTEROL: 190 mg/dL (ref 0–200)
HDL: 68.7 mg/dL (ref >39.00)
LDL Cholesterol: 110 mg/dL — ABNORMAL HIGH (ref 0–99)
NonHDL: 121.37
Total CHOL/HDL Ratio: 3
Triglycerides: 57 mg/dL (ref 0.0–149.0)
VLDL: 11.4 mg/dL (ref 0.0–40.0)

## 2016-03-27 LAB — TSH: TSH: 0.92 u[IU]/mL (ref 0.35–4.50)

## 2016-03-27 MED ORDER — CLONAZEPAM 0.5 MG PO TABS: ORAL_TABLET | ORAL | 2 refills | Status: DC

## 2016-03-27 NOTE — Patient Instructions (Signed)

## 2016-03-27 NOTE — Progress Notes (Signed)
Office Note 03/27/2016  CC:  Chief Complaint  Patient presents with  . Annual Exam    Pt is fasting.     HPI:  James Gallagher is a 36 y.o. White male who is here for annual health maintenance exam. Exercise: none Anxiety pretty well controlled, says he stretches his clonazepam out well. He is establishing with a dentist now.    Past Medical History:  Diagnosis Date  . Anxiety   . Rash    "bumps on my neck"  . Seasonal allergies   . Shortness of breath dyspnea    with exertion    Past Surgical History:  Procedure Laterality Date  . INGUINAL HERNIA REPAIR Left 04/10/2015   Procedure: LAPAROSCOPIC  LEFT  INGUINAL HERNIA REPAIR WITH MESH;  Surgeon: Ralene Ok, MD;  Location: [facility];  Service: General;  Laterality: Left;  . INSERTION OF MESH Left 04/10/2015   Procedure: INSERTION OF MESH;  Surgeon: Ralene Ok, MD;  Location: [facility];  Service: General;  Laterality: Left;  . WISDOM TOOTH EXTRACTION      Family History  Problem Relation Age of Onset  . Alcohol abuse Mother   . Arthritis Mother   . Rheum arthritis Maternal Grandfather   . Cancer Neg Hx   . Diabetes Neg Hx   . Heart disease Neg Hx     Social History   Social History  . Marital status: Single    Spouse name: N/A  . Number of children: N/A  . Years of education: N/A   Occupational History  . Not on file.   Social History Main Topics  . Smoking status: Former Smoker    Packs/day: 1.00    Years: 5.00    Types: Cigarettes    Quit date: 03/16/2012  . Smokeless tobacco: Never Used  . Alcohol use 3.0 oz/week    5 Cans of beer per week     Comment: 3-4 beers nightly  . Drug use:     Types: Marijuana     Comment: last use 04/07/15  . Sexual activity: Yes    Birth control/ protection: Condom   Other Topics Concern  . Not on file   Social History Narrative   Single, no children.   College: [employer].   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   Former  smoker: 5 pack-yr hx, quit 2014.   Alc: beer, 2-3 per evening.   No hx of alc or drug abuse.    Outpatient Medications Prior to Visit  Medication Sig Dispense Refill  . omeprazole (PRILOSEC) 40 MG capsule 1 cap po qAM 30 capsule 12  . clonazePAM (KLONOPIN) 0.5 MG tablet 1-2 tabs po bid prn severe anxiety 60 tablet 2  . tetrahydrozoline 0.05 % ophthalmic solution Place 1 drop into both eyes 2 (two) times daily as needed (Allergies).     No facility-administered medications prior to visit.     No Known Allergies  ROS Review of Systems  Constitutional: Negative for appetite change, chills, fatigue and fever.  HENT: Negative for congestion, dental problem, ear pain and sore throat.   Eyes: Negative for discharge, redness and visual disturbance.  Respiratory: Negative for cough, chest tightness, shortness of breath and wheezing.   Cardiovascular: Negative for chest pain, palpitations and leg swelling.  Gastrointestinal: Negative for abdominal pain, blood in stool, diarrhea, nausea and vomiting.  Genitourinary: Negative for difficulty urinating, dysuria, flank pain, frequency, hematuria and urgency.  Musculoskeletal: Negative for arthralgias,  back pain, joint swelling, myalgias and neck stiffness.  Skin: Negative for pallor and rash.  Neurological: Negative for dizziness, speech difficulty, weakness and headaches.  Hematological: Negative for adenopathy. Does not bruise/bleed easily.  Psychiatric/Behavioral: Negative for confusion and sleep disturbance. The patient is not nervous/anxious.     PE; Blood pressure 109/71, pulse 72, temperature 98.3 F (36.8 C), temperature source Oral, resp. rate 16, height 6' 1.5" (1.867 m), weight 166 lb 8 oz (75.5 kg), SpO2 97 %. Body mass index is 21.67 kg/m.  Gen: Alert, well appearing.  Patient is oriented to person, place, time, and situation. AFFECT: pleasant, lucid thought and speech. ENT: Ears: EACs clear, normal epithelium.  TMs with good  light reflex and landmarks bilaterally.  Eyes: no injection, icteris, swelling, or exudate.  EOMI, PERRLA. Nose: no drainage or turbinate edema/swelling.  No injection or focal lesion.  Mouth: lips without lesion/swelling.  Oral mucosa pink and moist.  Dentition intact and without obvious caries or gingival swelling.  Oropharynx without erythema, exudate, or swelling.  Neck: supple/nontender.  No LAD, mass, or TM.  Carotid pulses 2+ bilaterally, without bruits. CV: RRR, no m/r/g.   LUNGS: CTA bilat, nonlabored resps, good aeration in all lung fields. ABD: soft, NT, ND, BS normal.  No hepatospenomegaly or mass.  No bruits. EXT: no clubbing, cyanosis, or edema.  Musculoskeletal: no joint swelling, erythema, warmth, or tenderness.  ROM of all joints intact. Skin - no sores or suspicious lesions or rashes or color changes  Pertinent labs:  No results found for: TSH Lab Results  Component Value Date   WBC 6.0 04/08/2015   HGB 14.8 04/08/2015   HCT 45.5 04/08/2015   MCV 98.7 04/08/2015   PLT 235 04/08/2015   No results found for: CREATININE, BUN, NA, K, CL, CO2 No results found for: ALT, AST, GGT, ALKPHOS, BILITOT No results found for: CHOL No results found for: HDL No results found for: LDLCALC No results found for: TRIG No results found for: CHOLHDL No results found for: PSA  ASSESSMENT AND PLAN:   Health maintenance exam: Reviewed age and gender appropriate health maintenance issues (prudent diet, regular exercise, health risks of tobacco and excessive alcohol, use of seatbelts, fire alarms in home, use of sunscreen).  Also reviewed age and gender appropriate health screening as well as vaccine recommendations. Flu vaccine given today. Fasting HP labs today. RF'd clonazepam for his chronic anxiety today: clonaz 0.5mg , 1-2 bid prn severe anxiety, #60, rF x 2.  An After Visit Summary was printed and given to the patient.  FOLLOW UP:  Return in about 6 months (around 09/24/2016) for  f/u anxiety.  Signed:  Crissie Sickles, MD           03/27/2016

## 2016-03-28 LAB — HIV ANTIBODY (ROUTINE TESTING W REFLEX): HIV 1&2 Ab, 4th Generation: NONREACTIVE

## 2016-09-07 ENCOUNTER — Other Ambulatory Visit: Payer: Self-pay | Admitting: *Deleted

## 2016-09-07 MED ORDER — CLONAZEPAM 0.5 MG PO TABS: ORAL_TABLET | ORAL | 2 refills | Status: DC

## 2016-09-07 NOTE — Telephone Encounter (Signed)
Pt called requesting refills for clonazepam to go to [place].  RF request for clonazepam LOV: 03/27/16 Next ov: 09/25/16 Last written: 03/27/16 #60 w/ 2RF  Please advise. Thanks.

## 2016-09-07 NOTE — Telephone Encounter (Signed)
Rx faxed.   Pt advised and voiced understanding.   

## 2016-09-24 ENCOUNTER — Encounter: Payer: Self-pay | Admitting: *Deleted

## 2016-09-24 DIAGNOSIS — J302 Other seasonal allergic rhinitis: Secondary | ICD-10-CM | POA: Insufficient documentation

## 2016-09-24 DIAGNOSIS — F419 Anxiety disorder, unspecified: Secondary | ICD-10-CM | POA: Insufficient documentation

## 2016-09-25 ENCOUNTER — Encounter: Payer: Self-pay | Admitting: Family Medicine

## 2016-09-25 ENCOUNTER — Ambulatory Visit (INDEPENDENT_AMBULATORY_CARE_PROVIDER_SITE_OTHER): Payer: 59 | Admitting: Family Medicine

## 2016-09-25 VITALS — BP 99/64 | HR 54 | Temp 97.5°F | Resp 16 | Wt 156.5 lb

## 2016-09-25 DIAGNOSIS — F418 Other specified anxiety disorders: Secondary | ICD-10-CM

## 2016-09-25 DIAGNOSIS — F411 Generalized anxiety disorder: Secondary | ICD-10-CM

## 2016-09-25 NOTE — Progress Notes (Signed)
OFFICE VISIT  09/25/2016   CC:  Chief Complaint  Patient presents with  . Follow-up    anxiety   HPI:    Patient is a 36 y.o. Caucasian male who presents for 6 mo f/u anxiety. Anxiety: "I just deal with it". Out of the blue he gets a feeling of being overwhelmed with anxiety.  Takes clonaz sparingly; takes 1/2-1 tab avg of few times a week, helps. He prefers this over antidepressants that "freaked me out".  He denies depression. Going through rough personal life stuff last 2-3 weeks and he is struggling with coping with this, appetite has been down but he feels it coming back. He is exercising some.   Past Medical History:  Diagnosis Date  . Anxiety   . Rash    "bumps on my neck"  . Seasonal allergies   . Shortness of breath dyspnea    with exertion    Past Surgical History:  Procedure Laterality Date  . INGUINAL HERNIA REPAIR Left 04/10/2015   Procedure: LAPAROSCOPIC  LEFT  INGUINAL HERNIA REPAIR WITH MESH;  Surgeon: Ralene Ok, MD;  Location: [facility];  Service: General;  Laterality: Left;  . INSERTION OF MESH Left 04/10/2015   Procedure: INSERTION OF MESH;  Surgeon: Ralene Ok, MD;  Location: [facility];  Service: General;  Laterality: Left;  . WISDOM TOOTH EXTRACTION      Outpatient Medications Prior to Visit  Medication Sig Dispense Refill  . clonazePAM (KLONOPIN) 0.5 MG tablet 1-2 tabs po bid prn severe anxiety 60 tablet 2  . omeprazole (PRILOSEC) 40 MG capsule 1 cap po qAM 30 capsule 12   No facility-administered medications prior to visit.     No Known Allergies  ROS As per HPI  PE: Blood pressure 99/64, pulse (!) 54, temperature (!) 97.5 F (36.4 C), temperature source Oral, resp. rate 16, weight 156 lb 8 oz (71 kg), SpO2 100 %. Gen: Alert, well appearing.  Patient is oriented to person, place, time, and situation. AFFECT: pleasant, lucid thought and speech. CV: RRR, no m/r/g.   LUNGS: CTA bilat, nonlabored resps, good aeration in all lung  fields.  LABS:    Chemistry      Component Value Date/Time   NA 139 03/27/2016 0844   K 5.3 (H) 03/27/2016 0844   CL 103 03/27/2016 0844   CO2 31 03/27/2016 0844   BUN 19 03/27/2016 0844   CREATININE 0.95 03/27/2016 0844      Component Value Date/Time   CALCIUM 9.9 03/27/2016 0844   ALKPHOS 62 03/27/2016 0844   AST 18 03/27/2016 0844   ALT 14 03/27/2016 0844   BILITOT 0.9 03/27/2016 0844      IMPRESSION AND PLAN:  GAD, with situational anxiety as well. He wants to continue with current med treatment approach of clonazepam use sparingly. He had bad experience with antidepressant trial in the past so he does not want to try this anymore.  An After Visit Summary was printed and given to the patient.  FOLLOW UP: Return in about 6 months (around 03/28/2017) for annual CPE (fasting).  Signed:  Crissie Sickles, MD           09/25/2016

## 2017-01-06 ENCOUNTER — Telehealth: Payer: Self-pay | Admitting: Family Medicine

## 2017-01-06 NOTE — Telephone Encounter (Signed)
Pt has apt tomorrow with Dr. Anitra Lauth at 8:00am.

## 2017-01-06 NOTE — Telephone Encounter (Signed)
Noted  

## 2017-01-06 NOTE — Telephone Encounter (Signed)
[place] [facility] - Client [facility] Patient Name: James Gallagher DOB: 01/12/1981 Initial Comment Caller States he is calling to ask if it's beneficial to get a flu shot even after you already feel sick. Nurse Assessment Nurse: Jimmey Ralph, RN, Lissa Date/Time (Eastern Time): 01/06/2017 3:26:46 PM Confirm and document reason for call. If symptomatic, describe symptoms. ---Caller States he is calling to ask if it's beneficial to get a flu shot even after you already feel sick. I have felt crummy for the last two days. I don't know if I have been around anyone that has had the flu. It is possible I am runny a fever and I feel like I am getting sick. No aches or pains, No vomiting or nausea. No sore throat. Does the patient have any new or worsening symptoms? ---Yes Will a triage be completed? ---Yes Related visit to physician within the last 2 weeks? ---No Does the PT have any chronic conditions? (i.e. diabetes, asthma, etc.) ---No Is this a behavioral health or substance abuse call? ---No Guidelines Guideline Title Affirmed Question Affirmed Notes Influenza Exposure Needs a flu shot Final Disposition User See PCP When Office is Open (within 3 days) Hammonds, RN, Lissa Comments No runny nose No cough, No fatigue and no chest pain. I just don't feel right. Caller is requesting a phone call back from office and wants appointment in the morning hours within the next two days for flu screening and if no flu receive the flu vaccine. Referrals REFERRED TO PCP OFFICE REFERRED TO PCP OFFICE Caller Disagree/Comply Comply Caller Understands Yes PreDisposition Did not know what to do Call Id: [id number]

## 2017-01-07 ENCOUNTER — Telehealth: Payer: Self-pay

## 2017-01-07 ENCOUNTER — Encounter: Payer: Self-pay | Admitting: Family Medicine

## 2017-01-07 ENCOUNTER — Ambulatory Visit: Payer: Self-pay | Admitting: Family Medicine

## 2017-01-07 NOTE — Telephone Encounter (Signed)
[place] [facility] - Client [facility] Patient Name: James Gallagher Gender: Male DOB: Aug 14, 1980 Age: 36 Y 10 M 9 D Return Phone Number: [phone] (Primary) Address: City/State/Zip: [place] [address] Client [facility] - Client Client Site [place] - Day Physician Crissie Sickles - MD Contact Type Call Who Is Calling Patient / Member / Family / Caregiver Call Type Triage / Clinical Relationship To Patient Self Return Phone Number [phone] (Primary) Chief Complaint Health information question (non symptomatic) Reason for Call Symptomatic / Request for [place] he is calling to ask if it's beneficial to get a flu shot even after you already feel sick. Appointment Disposition EMR Patient Refused Appointment Info pasted into Epic Yes Translation No Nurse Assessment Nurse: Hammonds, RN, Epifanio Lesches Date/Time (Eastern Time): 01/06/2017 3:26:46 PM Confirm and document reason for call. If symptomatic, describe symptoms. ---Caller States he is calling to ask if it's beneficial to get a flu shot even after you already feel sick. I have felt crummy for the last two days. I don't know if I have been around anyone that has had the flu. It is possible I am runny a fever and I feel like I am getting sick. No aches or pains, No vomiting or nausea. No sore throat. Does the patient have any new or worsening symptoms? ---Yes Will a triage be completed? ---Yes Related visit to physician within the last 2 weeks? ---No Does the PT have any chronic conditions? (i.e. diabetes, asthma, etc.) ---No Is this a behavioral health or substance abuse call? ---No Guidelines Guideline Title Affirmed Question Affirmed Notes Nurse Date/Time (Eastern Time) Influenza Exposure Needs a flu shot Hammonds, RN, Lissa 01/06/2017 3:29:25 PM Disp. Time Eilene Ghazi Time)  Disposition Final User 01/06/2017 3:19:37 PM Attempt made - line busy Hammonds, RN, Lissa 01/06/2017 3:38:15 PM Send To RN Personal Hammonds, RN, Epifanio Lesches 01/06/2017 3:54:05 PM Send To RN Personal Martyn Ehrich, RN, Felicia PLEASE NOTE: All timestamps contained within this report are represented as [place] Standard Time. CONFIDENTIALTY NOTICE: This fax transmission is intended only for the addressee. It contains information that is legally privileged, confidential or otherwise protected from use or disclosure. If you are not the intended recipient, you are strictly prohibited from reviewing, disclosing, copying using or disseminating any of this information or taking any action in reliance on or regarding this information. If you have received this fax in error, please notify us immediately by telephone so that we can arrange for its return to [place]. Phone: [phone], Toll-Free: [phone], Fax: [phone] Page: 2 of 2 Call Id: [id number] 01/06/2017 3:34:06 PM See PCP When Office is Open (within 3 days) Yes Hammonds, RN, Lissa Caller Disagree/Comply Comply Caller Understands Yes PreDisposition Did not know what to do Care Advice Given Per Guideline SEE PCP WITHIN 3 DAYS: * You need to be seen within 2 or 3 days. Call your doctor during regular office hours and make an appointment. An urgent care center is often the best source of care if your doctor's office is closed or you can't get an appointment. NOTE: If office will be open tomorrow, tell caller to call then, not in 3 days. CALL BACK IF: * You have more questions. CARE ADVICE given per INFLUENZA EXPOSURE (Adult) guideline. Comments User: Moses Manners, RN Date/Time Eilene Ghazi Time): 01/06/2017 3:30:08 PM No runny nose No cough, No fatigue and no chest pain. I just don't feel right. User:  Lissa, Hammonds, RN Date/Time Eilene Ghazi Time): 01/06/2017 3:35:51 PM Caller is requesting a phone call back from office and wants appointment in the morning  hours within the next two days for flu screening and if no flu receive the flu vaccine. User: Moses Manners, RN Date/Time Eilene Ghazi Time): 01/06/2017 3:59:56 PM Contacted caller and he cancelled appointment related to "I don't have the money to have a Dr. appointment" and please cancel. Caller apoligized for "wasting your time". Re inforced with caller he isn't wasting our time and to please follow up and to recieved flu vaccine if currently no infection, illness or current flu. Referrals REFERRED TO PCP OFFICE REFERRED TO PCP OFFICE

## 2017-01-07 NOTE — Progress Notes (Deleted)
OFFICE VISIT  01/07/2017   CC: No chief complaint on file.    HPI:    Patient is a 36 y.o. Caucasian male who presents for malaise for the last 2-3 days.  Past Medical History:  Diagnosis Date  . Anxiety   . Rash    "bumps on my neck"  . Seasonal allergies   . Shortness of breath dyspnea    with exertion    Past Surgical History:  Procedure Laterality Date  . INGUINAL HERNIA REPAIR Left 04/10/2015   Procedure: LAPAROSCOPIC  LEFT  INGUINAL HERNIA REPAIR WITH MESH;  Surgeon: Ralene Ok, MD;  Location: [facility];  Service: General;  Laterality: Left;  . INSERTION OF MESH Left 04/10/2015   Procedure: INSERTION OF MESH;  Surgeon: Ralene Ok, MD;  Location: [facility];  Service: General;  Laterality: Left;  . WISDOM TOOTH EXTRACTION      Outpatient Medications Prior to Visit  Medication Sig Dispense Refill  . clonazePAM (KLONOPIN) 0.5 MG tablet 1-2 tabs po bid prn severe anxiety 60 tablet 2  . omeprazole (PRILOSEC) 40 MG capsule 1 cap po qAM 30 capsule 12   No facility-administered medications prior to visit.     No Known Allergies  ROS As per HPI  PE: There were no vitals taken for this visit. ***  LABS:    Chemistry      Component Value Date/Time   NA 139 03/27/2016 0844   K 5.3 (H) 03/27/2016 0844   CL 103 03/27/2016 0844   CO2 31 03/27/2016 0844   BUN 19 03/27/2016 0844   CREATININE 0.95 03/27/2016 0844      Component Value Date/Time   CALCIUM 9.9 03/27/2016 0844   ALKPHOS 62 03/27/2016 0844   AST 18 03/27/2016 0844   ALT 14 03/27/2016 0844   BILITOT 0.9 03/27/2016 0844     Lab Results  Component Value Date   WBC 5.7 03/27/2016   HGB 14.9 03/27/2016   HCT 43.9 03/27/2016   MCV 93.3 03/27/2016   PLT 229.0 03/27/2016    IMPRESSION AND PLAN:  No problem-specific Assessment & Plan notes found for this encounter.   FOLLOW UP: No Follow-up on file.

## 2017-01-07 NOTE — Telephone Encounter (Signed)
Noted. Apt cancelled.

## 2017-03-19 ENCOUNTER — Other Ambulatory Visit: Payer: Self-pay | Admitting: *Deleted

## 2017-03-19 MED ORDER — CLONAZEPAM 0.5 MG PO TABS: ORAL_TABLET | ORAL | 2 refills | Status: DC

## 2017-03-19 NOTE — Telephone Encounter (Signed)
[facility]  RF request for clonazepam LOV: 09/25/16 Next ov: 04/02/17 Last written: 09/07/16 #60 w/ 2RF  Please advise. Thanks.

## 2017-03-19 NOTE — Telephone Encounter (Signed)
Rx faxed

## 2017-03-22 ENCOUNTER — Telehealth: Payer: Self-pay

## 2017-03-22 NOTE — Telephone Encounter (Signed)
[place] Night - Client TELEPHONE [facility] Patient Name: ERIQUE KASER Gender: Male DOB: 08/20/1980 Age: 37 Y 21 D Return Phone Number: [phone] (Primary) Address: City/State/Zip:  [place] [address] Client [facility] - Client Client Site [place] Night Physician Crissie Sickles - MD Contact Type Call Who Is Calling Patient / Member / Family / Caregiver Call Type Triage / Clinical Relationship To Patient Self Return Phone Number [phone] (Primary) Chief Complaint Prescription Refill or Medication Request (non symptomatic) Reason for Call Symptomatic / Request for [place] states need prescription to be called into pharmacy. Clonazepam? Request call back Translation No Nurse Assessment Nurse: Jimmye Norman, RN, Olin Hauser Date/Time Eilene Ghazi Time): 03/20/2017 1:53:04 PM Confirm and document reason for call. If symptomatic, describe symptoms. ---caller states is out of clonazepam. would not answer my question about symptoms presently. did not want to continue conversation. I explained med is a controlled substance and he will have to follow up with office on Monday. states understanding. Does the patient have any new or worsening symptoms? ---No Guidelines Guideline Title Affirmed Question Affirmed Notes Nurse Date/Time (Eastern Time) Disp. Time Eilene Ghazi Time) Disposition Final User 03/20/2017 1:56:08 PM Clinical Call Jimmye Norman, RN, Olin Hauser 03/20/2017 1:56:19 PM Clinical Call Yes Jimmye Norman, RN, Otho Najjar Disagree/Comply Comply Caller Understands Yes PreDisposition InappropriateToAsk

## 2017-03-22 NOTE — Telephone Encounter (Signed)
Noted  

## 2017-04-01 DIAGNOSIS — Z0000 Encounter for general adult medical examination without abnormal findings: Secondary | ICD-10-CM | POA: Insufficient documentation

## 2017-04-02 ENCOUNTER — Encounter: Payer: Self-pay | Admitting: Family Medicine

## 2017-04-02 ENCOUNTER — Ambulatory Visit (INDEPENDENT_AMBULATORY_CARE_PROVIDER_SITE_OTHER): Payer: 59 | Admitting: Family Medicine

## 2017-04-02 VITALS — BP 110/62 | HR 65 | Temp 98.6°F | Resp 16 | Ht 73.5 in | Wt 160.5 lb

## 2017-04-02 DIAGNOSIS — M791 Myalgia, unspecified site: Secondary | ICD-10-CM

## 2017-04-02 DIAGNOSIS — B349 Viral infection, unspecified: Secondary | ICD-10-CM | POA: Diagnosis not present

## 2017-04-02 DIAGNOSIS — R5081 Fever presenting with conditions classified elsewhere: Secondary | ICD-10-CM

## 2017-04-02 LAB — COMPREHENSIVE METABOLIC PANEL
ALBUMIN: 4.8 g/dL (ref 3.5–5.2)
ALK PHOS: 65 U/L (ref 39–117)
ALT: 11 U/L (ref 0–53)
AST: 18 U/L (ref 0–37)
BUN: 17 mg/dL (ref 6–23)
CHLORIDE: 103 meq/L (ref 96–112)
CO2: 30 mEq/L (ref 19–32)
Calcium: 10.1 mg/dL (ref 8.4–10.5)
Creatinine, Ser: 0.89 mg/dL (ref 0.40–1.50)
GFR: 103.94 mL/min (ref >60.00)
GLUCOSE: 109 mg/dL — AB (ref 70–99)
POTASSIUM: 5 meq/L (ref 3.5–5.1)
SODIUM: 137 meq/L (ref 135–145)
TOTAL PROTEIN: 7 g/dL (ref 6.0–8.3)
Total Bilirubin: 0.6 mg/dL (ref 0.2–1.2)

## 2017-04-02 LAB — CBC WITH DIFFERENTIAL/PLATELET
Basophils Absolute: 0.1 10*3/uL (ref 0.0–0.1)
Basophils Relative: 1.2 % (ref 0.0–3.0)
EOS ABS: 0 10*3/uL (ref 0.0–0.7)
Eosinophils Relative: 0.9 % (ref 0.0–5.0)
HCT: 44.9 % (ref 39.0–52.0)
HEMOGLOBIN: 15.3 g/dL (ref 13.0–17.0)
LYMPHS ABS: 1.6 10*3/uL (ref 0.7–4.0)
Lymphocytes Relative: 30.6 % (ref 12.0–46.0)
MCHC: 34 g/dL (ref 30.0–36.0)
MCV: 94.1 fl (ref 78.0–100.0)
MONO ABS: 0.4 10*3/uL (ref 0.1–1.0)
Monocytes Relative: 8 % (ref 3.0–12.0)
NEUTROS PCT: 59.3 % (ref 43.0–77.0)
Neutro Abs: 3 10*3/uL (ref 1.4–7.7)
Platelets: 252 10*3/uL (ref 150.0–400.0)
RBC: 4.77 Mil/uL (ref 4.22–5.81)
RDW: 12.5 % (ref 11.5–15.5)
WBC: 5.1 10*3/uL (ref 4.0–10.5)

## 2017-04-02 LAB — POC INFLUENZA A&B (BINAX/QUICKVUE)
INFLUENZA A, POC: NEGATIVE
INFLUENZA B, POC: NEGATIVE

## 2017-04-02 LAB — MONONUCLEOSIS SCREEN: MONO SCREEN: NEGATIVE

## 2017-04-02 LAB — CK: Total CK: 81 U/L (ref 7–232)

## 2017-04-02 MED ORDER — MECLIZINE HCL 25 MG PO TABS: 25.0000 mg | ORAL_TABLET | Freq: Three times a day (TID) | ORAL | 1 refills | Status: DC | PRN

## 2017-04-02 NOTE — Patient Instructions (Signed)
Drink LOTS of gatorade and water.  TAke today and tomorrow off work and REST!

## 2017-04-02 NOTE — Progress Notes (Signed)
Office Note 04/02/2017  CC:  Chief Complaint  Patient presents with  . URI  . Dizziness  . Body Aches    HPI:  James Gallagher is a 37 y.o. White male who is here for annual health maintenance exam.  Has long hx of GAD with superimposed situational anxiety that has been adequately controlled with prn use of clonazepam 0.5 mg, 1-2 bid prn. He has not liked the way antidepressants made him feel in the past.  HOWEVER, pt has acute illness today so we chose to reschedule the CPE for another time.  Onset 8 d/a he started feeling lightheaded then had diarrhea x 1/2 day, fatigue.  No n/v.  NOrmal BMs resumed---formed and brown. Fatigue and lightheaded have persisted---says he "feels kind of drunk".  Sx's wax and wane but are more or less constant.  This morning feels a ST coming on.  Question of subjective fevers, no temp checked.  No abd pains. Signif body achiness.  No rash.  No nasal congestion or cough.   Hydrating well.  No abnormal urine/urinary sx's. No neck stiffness but says it is a bit sore all over , like the rest of his body. Took alka seltzer cold and flu.  Mother with similar sx's.  Several sick people at his work.    Past Medical History:  Diagnosis Date  . Anxiety   . Rash    "bumps on my neck"  . Seasonal allergies   . Shortness of breath dyspnea    with exertion    Past Surgical History:  Procedure Laterality Date  . INGUINAL HERNIA REPAIR Left 04/10/2015   Procedure: LAPAROSCOPIC  LEFT  INGUINAL HERNIA REPAIR WITH MESH;  Surgeon: Ralene Ok, MD;  Location: [facility];  Service: General;  Laterality: Left;  . INSERTION OF MESH Left 04/10/2015   Procedure: INSERTION OF MESH;  Surgeon: Ralene Ok, MD;  Location: [facility];  Service: General;  Laterality: Left;  . WISDOM TOOTH EXTRACTION      Family History  Problem Relation Age of Onset  . Alcohol abuse Mother   . Arthritis Mother   . Rheum arthritis Maternal Grandfather   . Cancer Neg Hx   . Diabetes  Neg Hx   . Heart disease Neg Hx     Social History   Socioeconomic History  . Marital status: Single    Spouse name: Not on file  . Number of children: Not on file  . Years of education: Not on file  . Highest education level: Not on file  Social Needs  . Financial resource strain: Not on file  . Food insecurity - worry: Not on file  . Food insecurity - inability: Not on file  . Transportation needs - medical: Not on file  . Transportation needs - non-medical: Not on file  Occupational History  . Not on file  Tobacco Use  . Smoking status: Former Smoker    Packs/day: 1.00    Years: 5.00    Pack years: 5.00    Types: Cigarettes    Last attempt to quit: 03/16/2012    Years since quitting: 5.0  . Smokeless tobacco: Never Used  Substance and Sexual Activity  . Alcohol use: Yes    Alcohol/week: 3.0 oz    Types: 5 Cans of beer per week    Comment: 3-4 beers nightly  . Drug use: Yes    Types: Marijuana    Comment: last use 04/07/15  . Sexual activity: Yes    Birth  control/protection: Condom  Other Topics Concern  . Not on file  Social History Narrative   Single, no children.   College: [employer].   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   Former smoker: 5 pack-yr hx, quit 2014.   Alc: beer, 2-3 per evening.   No hx of alc or drug abuse.    Outpatient Medications Prior to Visit  Medication Sig Dispense Refill  . clonazePAM (KLONOPIN) 0.5 MG tablet 1-2 tabs po bid prn severe anxiety 60 tablet 2  . omeprazole (PRILOSEC) 40 MG capsule 1 cap po qAM 30 capsule 12   No facility-administered medications prior to visit.     No Known Allergies  ROS See HPI PE; Blood pressure 110/62, pulse 65, temperature 98.6 F (37 C), temperature source Oral, resp. rate 16, height 6' 1.5" (1.867 m), weight 160 lb 8 oz (72.8 kg), SpO2 98 %.  Orthostatics: supine 98/62, rate 60, sitting up 106/68, rate 64, standing 102/68, pulse 64. Gen: Alert, well  appearing.  Patient is oriented to person, place, time, and situation. AFFECT: pleasant, lucid thought and speech. ENT: Ears: EACs clear, normal epithelium.  TMs with good light reflex and landmarks bilaterally.  Eyes: no injection, icteris, swelling, or exudate.  EOMI, PERRLA. Nose: no drainage or turbinate edema/swelling.  No injection or focal lesion.  Mouth: lips without lesion/swelling.  Oral mucosa pink and moist.  Dentition intact and without obvious caries or gingival swelling.  Oropharynx without erythema, exudate, or swelling.  Neck - No masses or thyromegaly or limitation in range of motion Kernig's and brudzinski's neg. CV: RRR, no m/r/g.   LUNGS: CTA bilat, nonlabored resps, good aeration in all lung fields. ABD: soft, NT, ND, BS normal.  No hepatospenomegaly or mass.  No bruits. EXT: no clubbing, cyanosis, or edema.  Skin - no sores or suspicious lesions or rashes or color changes  Pertinent labs:   Rapid flu test today: NEG  ASSESSMENT AND PLAN:   Viral syndrome, dehydration. Flu test here today: neg.  Orthostatics remarkable only for marginally low bp in all positions. Will check CBC, CMET, Monospot, CK total. Encouraged aggressive hydration, rest, meclizine rx'd. Encouraged time off work x 2 days.  An After Visit Summary was printed and given to the patient.  FOLLOW UP:  Return if symptoms worsen or fail to improve.  Signed:  Crissie Sickles, MD           04/02/2017

## 2017-04-20 ENCOUNTER — Other Ambulatory Visit: Payer: Self-pay

## 2017-04-20 MED ORDER — OMEPRAZOLE 40 MG PO CPDR: DELAYED_RELEASE_CAPSULE | ORAL | 1 refills | Status: DC

## 2017-04-30 ENCOUNTER — Ambulatory Visit (INDEPENDENT_AMBULATORY_CARE_PROVIDER_SITE_OTHER): Payer: 59 | Admitting: Family Medicine

## 2017-04-30 ENCOUNTER — Encounter: Payer: Self-pay | Admitting: Family Medicine

## 2017-04-30 ENCOUNTER — Encounter: Payer: Self-pay | Admitting: *Deleted

## 2017-04-30 VITALS — BP 98/63 | HR 70 | Temp 98.3°F | Resp 16 | Ht 73.0 in | Wt 160.2 lb

## 2017-04-30 DIAGNOSIS — F411 Generalized anxiety disorder: Secondary | ICD-10-CM | POA: Diagnosis not present

## 2017-04-30 DIAGNOSIS — Z0000 Encounter for general adult medical examination without abnormal findings: Secondary | ICD-10-CM | POA: Diagnosis not present

## 2017-04-30 DIAGNOSIS — Z23 Encounter for immunization: Secondary | ICD-10-CM | POA: Diagnosis not present

## 2017-04-30 DIAGNOSIS — Z79899 Other long term (current) drug therapy: Secondary | ICD-10-CM

## 2017-04-30 LAB — LIPID PANEL
CHOL/HDL RATIO: 3
Cholesterol: 190 mg/dL (ref 0–200)
HDL: 61.4 mg/dL (ref >39.00)
LDL CALC: 116 mg/dL — AB (ref 0–99)
NonHDL: 128.39
TRIGLYCERIDES: 60 mg/dL (ref 0.0–149.0)
VLDL: 12 mg/dL (ref 0.0–40.0)

## 2017-04-30 NOTE — Progress Notes (Signed)
Office Note 04/30/2017  CC:  Chief Complaint  Patient presents with  . Annual Exam    Pt is fasting.    HPI:  James Gallagher is a 37 y.o. White male who is here for annual health maintenance exam.  Anxiety: uses clonaz about 5 out of 7 days per week.  Takes anywhere from 1/2 to 1 tab at a time, says he has never taken more than 2 in one day. No self medication.  Most recent dose was yesterday.  He is back to normal state of health now, after about a 1 mo long viral syndrome consisting mainly of upper respiratory symptoms.  Past Medical History:  Diagnosis Date  . GAD (generalized anxiety disorder)   . GERD (gastroesophageal reflux disease)   . Rash    "bumps on my neck"  . Seasonal allergies   . Shortness of breath dyspnea    with exertion    Past Surgical History:  Procedure Laterality Date  . INGUINAL HERNIA REPAIR Left 04/10/2015   Procedure: LAPAROSCOPIC  LEFT  INGUINAL HERNIA REPAIR WITH MESH;  Surgeon: Ralene Ok, MD;  Location: [facility];  Service: General;  Laterality: Left;  . INSERTION OF MESH Left 04/10/2015   Procedure: INSERTION OF MESH;  Surgeon: Ralene Ok, MD;  Location: [facility];  Service: General;  Laterality: Left;  . WISDOM TOOTH EXTRACTION      Family History  Problem Relation Age of Onset  . Alcohol abuse Mother   . Arthritis Mother   . Rheum arthritis Maternal Grandfather   . Cancer Neg Hx   . Diabetes Neg Hx   . Heart disease Neg Hx     Social History   Socioeconomic History  . Marital status: Single    Spouse name: Not on file  . Number of children: Not on file  . Years of education: Not on file  . Highest education level: Not on file  Social Needs  . Financial resource strain: Not on file  . Food insecurity - worry: Not on file  . Food insecurity - inability: Not on file  . Transportation needs - medical: Not on file  . Transportation needs - non-medical: Not on file  Occupational History  . Not on file  Tobacco Use   . Smoking status: Former Smoker    Packs/day: 1.00    Years: 5.00    Pack years: 5.00    Types: Cigarettes    Last attempt to quit: 03/16/2012    Years since quitting: 5.1  . Smokeless tobacco: Never Used  Substance and Sexual Activity  . Alcohol use: Yes    Alcohol/week: 3.0 oz    Types: 5 Cans of beer per week    Comment: 3-4 beers nightly  . Drug use: Yes    Types: Marijuana    Comment: last use 04/07/15  . Sexual activity: Yes    Birth control/protection: Condom  Other Topics Concern  . Not on file  Social History Narrative   Single, no children.   College: [employer].   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   Former smoker: 5 pack-yr hx, quit 2014.   Alc: beer, 2-3 per evening.   No hx of alc or drug abuse.    Outpatient Medications Prior to Visit  Medication Sig Dispense Refill  . clonazePAM (KLONOPIN) 0.5 MG tablet 1-2 tabs po bid prn severe anxiety 60 tablet 2  . omeprazole (PRILOSEC) 40 MG capsule 1 cap po  qAM 180 capsule 1  . meclizine (ANTIVERT) 25 MG tablet Take 1 tablet (25 mg total) by mouth 3 (three) times daily as needed for dizziness. (Patient not taking: Reported on 04/30/2017) 30 tablet 1   No facility-administered medications prior to visit.     No Known Allergies  ROS Review of Systems  Constitutional: Negative for appetite change, chills, fatigue and fever.  HENT: Negative for congestion, dental problem, ear pain and sore throat.   Eyes: Negative for discharge, redness and visual disturbance.  Respiratory: Negative for cough, chest tightness, shortness of breath and wheezing.   Cardiovascular: Negative for chest pain, palpitations and leg swelling.  Gastrointestinal: Negative for abdominal pain, blood in stool, diarrhea, nausea and vomiting.  Genitourinary: Negative for difficulty urinating, dysuria, flank pain, frequency, hematuria and urgency.  Musculoskeletal: Negative for arthralgias, back pain, joint swelling,  myalgias and neck stiffness.  Skin: Negative for pallor and rash.  Neurological: Negative for dizziness, speech difficulty, weakness and headaches.  Hematological: Negative for adenopathy. Does not bruise/bleed easily.  Psychiatric/Behavioral: Negative for confusion and sleep disturbance. The patient is not nervous/anxious.     PE; Blood pressure 98/63, pulse 70, temperature 98.3 F (36.8 C), temperature source Oral, resp. rate 16, height 6\' 1"  (1.854 m), weight 160 lb 4 oz (72.7 kg), SpO2 97 %. Body mass index is 21.14 kg/m.  Gen: Alert, well appearing.  Patient is oriented to person, place, time, and situation. AFFECT: pleasant, lucid thought and speech. ENT: Ears: EACs clear, normal epithelium.  TMs with good light reflex and landmarks bilaterally.  Eyes: no injection, icteris, swelling, or exudate.  EOMI, PERRLA. Nose: no drainage or turbinate edema/swelling.  No injection or focal lesion.  Mouth: lips without lesion/swelling.  Oral mucosa pink and moist.  Dentition intact and without obvious caries or gingival swelling.  Oropharynx without erythema, exudate, or swelling.  Neck: supple/nontender.  No LAD, mass, or TM.  Carotid pulses 2+ bilaterally, without bruits. CV: RRR, no m/r/g.   LUNGS: CTA bilat, nonlabored resps, good aeration in all lung fields. ABD: soft, NT, ND, BS normal.  No hepatospenomegaly or mass.  No bruits. EXT: no clubbing, cyanosis, or edema.  Musculoskeletal: no joint swelling, erythema, warmth, or tenderness.  ROM of all joints intact. Skin - no sores or suspicious lesions or rashes or color changes   Pertinent labs:  Lab Results  Component Value Date   TSH 0.92 03/27/2016   Lab Results  Component Value Date   WBC 5.1 04/02/2017   HGB 15.3 04/02/2017   HCT 44.9 04/02/2017   MCV 94.1 04/02/2017   PLT 252.0 04/02/2017   Lab Results  Component Value Date   CREATININE 0.89 04/02/2017   BUN 17 04/02/2017   NA 137 04/02/2017   K 5.0 04/02/2017   CL 103  04/02/2017   CO2 30 04/02/2017   Lab Results  Component Value Date   ALT 11 04/02/2017   AST 18 04/02/2017   ALKPHOS 65 04/02/2017   BILITOT 0.6 04/02/2017   Lab Results  Component Value Date   CHOL 190 03/27/2016   Lab Results  Component Value Date   HDL 68.70 03/27/2016   Lab Results  Component Value Date   LDLCALC 110 (H) 03/27/2016   Lab Results  Component Value Date   TRIG 57.0 03/27/2016   Lab Results  Component Value Date   CHOLHDL 3 03/27/2016    ASSESSMENT AND PLAN:   Health maintenance exam: Reviewed age and gender appropriate health maintenance issues (  prudent diet, regular exercise, health risks of tobacco and excessive alcohol, use of seatbelts, fire alarms in home, use of sunscreen).  Also reviewed age and gender appropriate health screening as well as vaccine recommendations. Vaccines: Tdap UTD.  Flu vaccine--given today. Labs: FLP (screening for hyperlipidemia) today.  CBC, CMET, TSH all normal about 1 mo ago when pt here for an illness.  GAD: on benzo and using fairly sparingly. Controlled substance contract reviewed with pt, signed, in chart today. Will do UDS today.  Expect to see clonaz only.    An After Visit Summary was printed and given to the patient.  FOLLOW UP:  Return in about 6 months (around 10/28/2017) for f/u anx.  Signed:  Crissie Sickles, MD           04/30/2017

## 2017-04-30 NOTE — Addendum Note (Signed)
Addended by: Onalee Hua on: 04/30/2017 08:49 AM   Modules accepted: Orders

## 2017-04-30 NOTE — Patient Instructions (Signed)

## 2017-05-04 LAB — PAIN MGMT, PROFILE 8 W/CONF, U
6 Acetylmorphine: NEGATIVE ng/mL (ref <10)
ALCOHOL METABOLITES: POSITIVE ng/mL — AB (ref <500)
ALPHAHYDROXYTRIAZOLAM: NEGATIVE ng/mL (ref <50)
AMPHETAMINES: NEGATIVE ng/mL (ref <500)
Alphahydroxyalprazolam: NEGATIVE ng/mL (ref <25)
Alphahydroxymidazolam: NEGATIVE ng/mL (ref <50)
Aminoclonazepam: 69 ng/mL — ABNORMAL HIGH (ref <25)
BUPRENORPHINE, URINE: NEGATIVE ng/mL (ref <5)
Benzodiazepines: POSITIVE ng/mL — AB (ref <100)
Cocaine Metabolite: NEGATIVE ng/mL (ref <150)
Creatinine: 87.4 mg/dL
ETHYL GLUCURONIDE (ETG): 32108 ng/mL — AB (ref <500)
ETHYL SULFATE (ETS): 4454 ng/mL — AB (ref <100)
Hydroxyethylflurazepam: NEGATIVE ng/mL (ref <50)
Lorazepam: NEGATIVE ng/mL (ref <50)
MDMA: NEGATIVE ng/mL (ref <500)
Marijuana Metabolite: 78 ng/mL — ABNORMAL HIGH (ref <5)
Marijuana Metabolite: POSITIVE ng/mL — AB (ref <20)
Nordiazepam: NEGATIVE ng/mL (ref <50)
OPIATES: NEGATIVE ng/mL (ref <100)
OXAZEPAM: NEGATIVE ng/mL (ref <50)
OXIDANT: NEGATIVE ug/mL (ref <200)
Oxycodone: NEGATIVE ng/mL (ref <100)
PH: 5.87 (ref 4.5–9.0)
Temazepam: NEGATIVE ng/mL (ref <50)

## 2017-10-29 ENCOUNTER — Encounter: Payer: Self-pay | Admitting: Family Medicine

## 2017-10-29 ENCOUNTER — Ambulatory Visit: Payer: 59 | Admitting: Family Medicine

## 2017-10-29 VITALS — BP 194/58 | HR 70 | Temp 98.4°F | Resp 16 | Ht 73.0 in | Wt 166.5 lb

## 2017-10-29 DIAGNOSIS — Z91030 Bee allergy status: Secondary | ICD-10-CM | POA: Diagnosis not present

## 2017-10-29 DIAGNOSIS — Z23 Encounter for immunization: Secondary | ICD-10-CM

## 2017-10-29 DIAGNOSIS — F411 Generalized anxiety disorder: Secondary | ICD-10-CM | POA: Diagnosis not present

## 2017-10-29 MED ORDER — EPINEPHRINE 0.3 MG/0.3ML IJ SOAJ: 0.3000 mg | Freq: Once | INTRAMUSCULAR | 2 refills | Status: AC

## 2017-10-29 NOTE — Progress Notes (Signed)
OFFICE VISIT  10/29/2017   CC:  Chief Complaint  Patient presents with  . Follow-up    [facility]  . Referral    to Asthma and Allergy   HPI:    Patient is a 37 y.o. Caucasian male who presents for f/u GAD. His last visit was 04/30/17, at which time I countinued him on prn clonazepam. His UDS that day returned + for benzos appropriately, but also showed marijuana.  He did sign a CSC that day.  Stress levels/anxiety wax and wane, sometimes to the point of needing clonazepam about 4 times per week. When I confronted him about his UDS + marijuana he states that a UDS today would show the same thing. I told him I cannot rx any controlled substance when he is using an illicit drug.  He expressed understanding.  States he wants a referral to asthma and allergy clinic:  He desires allergy testing.  Has hx of breaking out in hives when stung by bees and/or yellow jackets, is worried about potential other allergens he may need to be wary of (avoid) and be ready for similar reaction or worse.  Has runny nose "all the time" and has always assumed he has environmental allergies.   No hx of anaphylaxis.  Past Medical History:  Diagnosis Date  . GAD (generalized anxiety disorder)   . GERD (gastroesophageal reflux disease)   . Rash    "bumps on my neck"  . Seasonal allergies   . Shortness of breath dyspnea    with exertion    Past Surgical History:  Procedure Laterality Date  . INGUINAL HERNIA REPAIR Left 04/10/2015   Procedure: LAPAROSCOPIC  LEFT  INGUINAL HERNIA REPAIR WITH MESH;  Surgeon: Ralene Ok, MD;  Location: [facility];  Service: General;  Laterality: Left;  . INSERTION OF MESH Left 04/10/2015   Procedure: INSERTION OF MESH;  Surgeon: Ralene Ok, MD;  Location: [facility];  Service: General;  Laterality: Left;  . WISDOM TOOTH EXTRACTION      Outpatient Medications Prior to Visit  Medication Sig Dispense Refill  . omeprazole (PRILOSEC) 40 MG capsule 1 cap po qAM 180 capsule 1  .  clonazePAM (KLONOPIN) 0.5 MG tablet 1-2 tabs po bid prn severe anxiety 60 tablet 2   No facility-administered medications prior to visit.     No Known Allergies  ROS As per HPI  PE: Blood pressure (!) 194/58, pulse 70, temperature 98.4 F (36.9 C), temperature source Oral, resp. rate 16, height 6\' 1"  (1.854 m), weight 166 lb 8 oz (75.5 kg), SpO2 99 %. Gen: Alert, well appearing.  Patient is oriented to person, place, time, and situation. AFFECT: pleasant, lucid thought and speech. No further exam today.  LABS:  Lab Results  Component Value Date   TSH 0.92 03/27/2016   Lab Results  Component Value Date   WBC 5.1 04/02/2017   HGB 15.3 04/02/2017   HCT 44.9 04/02/2017   MCV 94.1 04/02/2017   PLT 252.0 04/02/2017   Lab Results  Component Value Date   CREATININE 0.89 04/02/2017   BUN 17 04/02/2017   NA 137 04/02/2017   K 5.0 04/02/2017   CL 103 04/02/2017   CO2 30 04/02/2017   Lab Results  Component Value Date   ALT 11 04/02/2017   AST 18 04/02/2017   ALKPHOS 65 04/02/2017   BILITOT 0.6 04/02/2017   Lab Results  Component Value Date   CHOL 190 04/30/2017   Lab Results  Component Value Date  HDL 61.40 04/30/2017   Lab Results  Component Value Date   LDLCALC 116 (H) 04/30/2017   Lab Results  Component Value Date   TRIG 60.0 04/30/2017   Lab Results  Component Value Date   CHOLHDL 3 04/30/2017    IMPRESSION AND PLAN:  1) GAD, helped by prn clonazepam. Failed UDS, states he has not quit smoking marijuana.  Reminded pt of CSC specifics. Told him that if he quit marijuana and a UDS returned appropriate, then I would be ok with rx'ing him clonazepam. He expressed understanding.  No clonaz rx given today. He does not use this med often enough to experience withdrawal.  2) Allergy to yellow jackets: Epi pen rx.  Referral to allergy/immunology clinic for further allergy testing. Flu vaccine given today.  An After Visit Summary was printed and given to  the patient.  FOLLOW UP: Return for as needed.  Signed:  Crissie Sickles, MD           10/29/2017

## 2018-06-12 ENCOUNTER — Other Ambulatory Visit: Payer: Self-pay | Admitting: Family Medicine

## 2019-06-13 ENCOUNTER — Other Ambulatory Visit: Payer: Self-pay | Admitting: Family Medicine

## 2024-04-20 ENCOUNTER — Encounter: Payer: Self-pay | Admitting: Gastroenterology

## 2024-04-20 ENCOUNTER — Ambulatory Visit: Admitting: Gastroenterology

## 2024-04-20 VITALS — BP 126/70 | HR 72 | Ht 73.0 in | Wt 149.0 lb

## 2024-04-20 DIAGNOSIS — R630 Anorexia: Secondary | ICD-10-CM | POA: Diagnosis not present

## 2024-04-20 DIAGNOSIS — R194 Change in bowel habit: Secondary | ICD-10-CM

## 2024-04-20 DIAGNOSIS — F411 Generalized anxiety disorder: Secondary | ICD-10-CM

## 2024-04-20 DIAGNOSIS — R634 Abnormal weight loss: Secondary | ICD-10-CM | POA: Diagnosis not present

## 2024-04-20 DIAGNOSIS — K625 Hemorrhage of anus and rectum: Secondary | ICD-10-CM

## 2024-04-20 DIAGNOSIS — K6289 Other specified diseases of anus and rectum: Secondary | ICD-10-CM

## 2024-04-20 DIAGNOSIS — Z800 Family history of malignant neoplasm of digestive organs: Secondary | ICD-10-CM | POA: Diagnosis not present

## 2024-04-20 MED ORDER — NA SULFATE-K SULFATE-MG SULF 17.5-3.13-1.6 GM/177ML PO SOLN: 1.0000 | Freq: Once | ORAL | 0 refills | Status: AC

## 2024-04-20 NOTE — Progress Notes (Signed)
 "  Chief Complaint:constipation Primary GI Doctor: Dr. San  HPI:  Patient is a  44  year old male patient with past medical history of GERD, anxiety, and constipation, who was referred to me by Eldonna Ubaldo RAMAN, NP on 04/13/24 for a evaluation of constipation .    Interval History Patient presents to discuss altered bowel habits, weight loss, and poor appetite.  Chronic constipation  Patient presents for evaluation of constipation for past year.  Patient reports he tried OTC Miralax po daily for 2 weeks which helped some, but does not feel he completely empties out. He will have to go to the restroom multiple times without much result. Reports having rectal discomfort, sharp pain that can occur randomly throughout the day at work. He does lift at work 70-80lbs occasionally. Reports he has had a few episodes of BRB with wiping. Denies straining or pushing. Denies abdominal pain.  No new medications.  Weight loss, poor appetite He reports he has been eating less. He reports it has been a hard 1-2 years. His mother passed away at age 57 with pancreatic CA. Patient has one sister. He reports his normal weight is 165-170lbs.  Denies nausea or vomiting.   Complains of dry mouth.  Does not take any herbal supplements. Tries to consume high protein diet and eat healthy.  Former smoker, stopped few years ago. Stopped drinking December 2023- was drinking daily few beers/day.   Surgical history: inguinal hernia repair   Patient has history of anxiety, reports he was previously on klonopin  which helped in past. His PCP prescribed him Zoloft which he admits he has not started.   Patient also complains of insomnia.   Patient's family history includes: no colon CA, no esophageal CA. Unknown history of fathers side.     Wt Readings from Last 3 Encounters:  04/20/24 149 lb (67.6 kg)  10/29/17 166 lb 8 oz (75.5 kg)  04/30/17 160 lb 4 oz (72.7 kg)    Past Medical History:  Diagnosis Date    GAD (generalized anxiety disorder)    GERD (gastroesophageal reflux disease)    Rash    bumps on my neck   Seasonal allergies    Shortness of breath dyspnea    with exertion    Past Surgical History:  Procedure Laterality Date   INGUINAL HERNIA REPAIR Left 04/10/2015   Procedure: LAPAROSCOPIC  LEFT  INGUINAL HERNIA REPAIR WITH MESH;  Surgeon: Lynda Leos, MD;  Location: [facility];  Service: General;  Laterality: Left;   INSERTION OF MESH Left 04/10/2015   Procedure: INSERTION OF MESH;  Surgeon: Lynda Leos, MD;  Location: [facility];  Service: General;  Laterality: Left;   WISDOM TOOTH EXTRACTION      Current Outpatient Medications  Medication Sig Dispense Refill   Na Sulfate-K Sulfate-Mg Sulfate concentrate (SUPREP) 17.5-3.13-1.6 GM/177ML SOLN Take 1 kit (354 mLs total) by mouth once for 1 dose. 354 mL 0   No current facility-administered medications for this visit.    Allergies as of 04/20/2024   (No Known Allergies)    Family History  Problem Relation Age of Onset   Alcohol abuse Mother    Arthritis Mother    Rheum arthritis Maternal Grandfather    Cancer Neg Hx    Diabetes Neg Hx    Heart disease Neg Hx     Review of Systems:    Constitutional: No weight loss, fever, chills, weakness or fatigue HEENT: Eyes: No change in vision  Ears, Nose, Throat:  No change in hearing or congestion Skin: No rash or itching Cardiovascular: No chest pain, chest pressure or palpitations   Respiratory: No SOB or cough Gastrointestinal: See HPI and otherwise negative Genitourinary: No dysuria or change in urinary frequency Neurological: No headache, dizziness or syncope Musculoskeletal: No new muscle or joint pain Hematologic: No bleeding or bruising Psychiatric: No history of depression or anxiety    Physical Exam:  Vital signs: BP 126/70   Pulse 72   Ht 6' 1 (1.854 m)   Wt 149 lb (67.6 kg)   SpO2 99%   BMI 19.66 kg/m   Constitutional:   Pleasant male  appears to be in NAD, Well developed, Well nourished, alert and cooperative Eyes:   PEERL, EOMI. No icterus. Conjunctiva pink. Neck:  Supple Throat: Oral cavity and pharynx without inflammation, swelling or lesion.  Respiratory: Respirations even and unlabored. Lungs clear to auscultation bilaterally.   No wheezes, crackles, or rhonchi.  Cardiovascular: Normal S1, S2. Regular rate and rhythm. No peripheral edema, cyanosis or pallor.  Gastrointestinal:  Soft, nondistended, nontender. No rebound or guarding. Normal bowel sounds. No appreciable masses or hepatomegaly. Rectal:  Not performed.  Msk:  Symmetrical without gross deformities. Without edema, no deformity or joint abnormality.  Neurologic:  Alert and  oriented x4;  grossly normal neurologically.  Skin:   Dry and intact without significant lesions or rashes.  RELEVANT LABS AND IMAGING: CBC    Latest Ref Rng & Units 04/02/2017    9:58 AM 03/27/2016    8:44 AM 04/08/2015    8:45 AM  CBC  WBC 4.0 - 10.5 K/uL 5.1  5.7  6.0   Hemoglobin 13.0 - 17.0 g/dL 84.6  85.0  85.1   Hematocrit 39.0 - 52.0 % 44.9  43.9  45.5   Platelets 150.0 - 400.0 K/uL 252.0  229.0  235      CMP     Latest Ref Rng & Units 04/02/2017    9:58 AM 03/27/2016    8:44 AM  CMP  Glucose 70 - 99 mg/dL 890  94   BUN 6 - 23 mg/dL 17  19   Creatinine 9.59 - 1.50 mg/dL 9.10  9.04   Sodium 864 - 145 mEq/L 137  139   Potassium 3.5 - 5.1 mEq/L 5.0  5.3   Chloride 96 - 112 mEq/L 103  103   CO2 19 - 32 mEq/L 30  31   Calcium 8.4 - 10.5 mg/dL 89.8  9.9   Total Protein 6.0 - 8.3 g/dL 7.0  6.7   Total Bilirubin 0.2 - 1.2 mg/dL 0.6  0.9   Alkaline Phos 39 - 117 U/L 65  62   AST 0 - 37 U/L 18  18   ALT 0 - 53 U/L 11  14      Lab Results  Component Value Date   TSH 0.92 03/27/2016   02/2024 : wbc 6.6, hgb 14.9, plt 264, BUN 16, creat 0.91, normal LFTs, TSH 0.765  Assessment/Plan: Encounter Diagnoses  Name Primary?   Loss of weight Yes   Poor appetite    Altered  bowel habits    Rectal bleeding    Rectal pain    Family history of pancreatic cancer    Generalized anxiety disorder      44 year old male patient who presents with altered bowel habits, intermittent rectal bleeding, and rectal discomfort over the course of the last year.  Patient has tried over-the-counter laxative such as  MiraLAX which helps some but continues to have have incomplete evacuation.  Will go ahead and provide samples of pro secretory agent Linzess as well as educated on high-fiber diet with drinking plenty of fluids.  Declines rectal exam.  will go ahead and proceed with colonoscopy to evaluate and rule out IBD, colitis and/or colonic polyps.    Patient also reports about a 20 pound weight loss over the course of the last year or so.  Patient reports his appetite has decreased and has had a great deal of stress with the recent loss of his mother to pancreatic cancer.  Will go ahead and order CAT scan to rule out any probable cause of weight loss including but not limited to cancer.  Also schedule upper GI endoscopy with biopsy to rule out peptic ulcer disease, gastritis and/or tumor.  Encourage patient to consume high-protein diet with protein shakes.  Patient also open to seeing behavioral therapy due to uncontrolled anxiety, insomnia and recent passing away of his mother which also seems to have affected his overall wellbeing as well.   Order CTAP  Schedule for EGD in [facility] with Dr. San. The risks and benefits of EGD with possible biopsies and esophageal dilation were discussed with the patient who agrees to proceed. 3.  Schedule for a colonoscopy in [facility] with Dr. San. The risks and benefits of colonoscopy with possible polypectomy / biopsies were discussed and the patient agrees to proceed.  4. Recommend protein drinks BID-TID 5. Recommend High fiber diet   6. Samples of Linzess 72 mcg po daily provided 7. Referral for behavioral counseling      Thank you for the  courtesy of this consult. Please call me with any questions or concerns.   Kellyjo Edgren, FNP-C [facility] 04/20/2024, 3:50 PM  Cc: Eldonna Ubaldo RAMAN, NP  "

## 2024-04-20 NOTE — Patient Instructions (Signed)
 Dry mouth  Biotene Oral Rinse Mouthwash for Dry Mouth, Alcohol-Free, Fresh Mint, 8 OZ OTC lozenges to moisturize mouth  Drink plenty of water    Weight loss, poor appetite Recommend protein shakes BID- TID    Altered bowel habits  Samples of Linzess 72 mcg po daily, take 1 capsule 30-45 mins before first meal of day  Recommend high fiber diet  No straining or pushing with bowel movements    You have been scheduled for a CT scan of the abdomen and pelvis at [facility], 1st floor Radiology. You are scheduled on 04/22/24 at 1:30pm. You should arrive 15 minutes prior to your appointment time for registration.   The purpose of you drinking the oral contrast is to aid in the visualization of your intestinal tract. The contrast solution may cause some diarrhea. Depending on your individual set of symptoms, you may also receive an intravenous injection of x-ray contrast/dye. Plan on being at [facility] for 45 minutes or longer, depending on the type of exam you are having performed.   If you have any questions regarding your exam or if you need to reschedule, you may call [facility] at [phone] between the hours of 8:00 am and 5:00 pm, Monday-Friday.   We have sent the following medications to your pharmacy for you to pick up at your convenience: SUPREP  You have been scheduled for an endoscopy and colonoscopy. Please follow the written instructions given to you at your visit today.  If you use inhalers (even only as needed), please bring them with you on the day of your procedure.  DO NOT TAKE 7 DAYS PRIOR TO TEST- Trulicity (dulaglutide) Ozempic, Wegovy (semaglutide) Mounjaro, Zepbound (tirzepatide) Bydureon Bcise (exanatide extended release)  DO NOT TAKE 1 DAY PRIOR TO YOUR TEST Rybelsus (semaglutide) Adlyxin (lixisenatide) Victoza (liraglutide) Byetta (exanatide) ___________________________________________________________________________   Due to recent  changes in healthcare laws, you may see the results of your imaging and laboratory studies on MyChart before your provider has had a chance to review them.  We understand that in some cases there may be results that are confusing or concerning to you. Not all laboratory results come back in the same time frame and the provider may be waiting for multiple results in order to interpret others.  Please give us  48 hours in order for your provider to thoroughly review all the results before contacting the office for clarification of your results.   _______________________________________________________  If your blood pressure at your visit was 140/90 or greater, please contact your primary care physician to follow up on this.  _______________________________________________________  If you are age 21 or older, your body mass index should be between 23-30. Your Body mass index is 19.66 kg/m. If this is out of the aforementioned range listed, please consider follow up with your Primary Care Provider.  If you are age 77 or younger, your body mass index should be between 19-25. Your Body mass index is 19.66 kg/m. If this is out of the aformentioned range listed, please consider follow up with your Primary Care Provider.   ________________________________________________________  The [place] [facility] providers would like to encourage you to use MYCHART to communicate with providers for non-urgent requests or questions.  Due to long hold times on the telephone, sending your provider a message by [facility] may be a faster and more efficient way to get a response.  Please allow 48 business hours for a response.  Please remember that this is for non-urgent requests.  _______________________________________________________  [facility] is using a team-based approach to care.  Your team is made up of your doctor and two to three APPS. Our APPS (Nurse Practitioners and Physician Assistants) work with your  physician to ensure care continuity for you. They are fully qualified to address your health concerns and develop a treatment plan. They communicate directly with your gastroenterologist to care for you. Seeing the Advanced Practice Practitioners on your physician's team can help you by facilitating care more promptly, often allowing for earlier appointments, access to diagnostic testing, procedures, and other specialty referrals.   Thank you for trusting me with your gastrointestinal care. Deanna May, FNP-C

## 2024-04-22 ENCOUNTER — Ambulatory Visit (HOSPITAL_BASED_OUTPATIENT_CLINIC_OR_DEPARTMENT_OTHER): Admission: RE | Admit: 2024-04-22

## 2024-04-29 ENCOUNTER — Ambulatory Visit (HOSPITAL_BASED_OUTPATIENT_CLINIC_OR_DEPARTMENT_OTHER)

## 2024-05-09 ENCOUNTER — Encounter: Admitting: Gastroenterology
